# Patient Record
Sex: Male | Born: 1970
Health system: Southern US, Community
[De-identification: ages and names within clinical notes are randomized; demographics above are authoritative.]

## PROBLEM LIST (undated history)

## (undated) DIAGNOSIS — M109 Gout, unspecified: Secondary | ICD-10-CM

## (undated) DIAGNOSIS — I10 Essential (primary) hypertension: Secondary | ICD-10-CM

---
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
## 2012-10-16 ENCOUNTER — Encounter: Payer: Self-pay | Admitting: *Deleted

## 2012-10-16 ENCOUNTER — Emergency Department
Admission: EM | Admit: 2012-10-16 | Discharge: 2012-10-16 | Disposition: A | Discharge status: Home / Self Care | Payer: Self-pay | Source: Home / Self Care | Attending: Family Medicine | Admitting: Family Medicine

## 2012-10-16 DIAGNOSIS — M109 Gout, unspecified: Secondary | ICD-10-CM

## 2012-10-16 HISTORY — DX: Essential (primary) hypertension: I10

## 2012-10-16 MED ORDER — COLCHICINE 0.6 MG PO TABS: ORAL_TABLET | ORAL | Status: DC

## 2012-10-16 MED ORDER — PREDNISONE 20 MG PO TABS: 20.0000 mg | ORAL_TABLET | Freq: Two times a day (BID) | ORAL | Status: DC

## 2012-10-16 MED ORDER — HYDROCODONE-ACETAMINOPHEN 5-325 MG PO TABS: ORAL_TABLET | ORAL | Status: DC

## 2012-10-16 NOTE — ED Notes (Signed)
Patient reports gout flare up in left ankle x 8 days. He has a Hx of gout in same location. Swelling and pain is now extended from ankle to toes in left foot. Taken IBF without relief. Previous ankle injury and surgery with plates in left ankle.

## 2012-10-16 NOTE — ED Provider Notes (Signed)
History     CSN: [id number]  Arrival date & time 10/16/12  1012   First MD Initiated Contact with Patient 10/16/12 1040      Chief Complaint  Patient presents with  . Gout      HPI Comments: Patient complains of a recurrent flare-up of gout in his left ankle, foot, and great toe for about 8 days.  He states that he began having gout attacks about 3 years ago when he started taking blood pressure medicine (note lisinopril/HCTZ), and now has a flare-up about every two months.  This episode has lasted the longest of any previous, and has not responded to Ibuprofen.  He has difficulty walking.  Patient is a 42 y.o. male presenting with lower extremity pain. The history is provided by the patient and the spouse.  Foot Pain This is a recurrent problem. Episode onset: 8 days ago. The problem occurs constantly. The problem has been gradually worsening. Associated symptoms comments: none. The symptoms are aggravated by walking and standing. Nothing relieves the symptoms. Treatments tried: Ibuprofen. The treatment provided mild relief.    Past Medical History  Diagnosis Date  . Hypertension     Past Surgical History  Procedure Date  . Ankle fracture surgery     left    History reviewed. No pertinent family history.  History  Substance Use Topics  . Smoking status: Former Games developer  . Smokeless tobacco: Not on file  . Alcohol Use: No      Review of Systems  All other systems reviewed and are negative.    Allergies  Review of patient's allergies indicates no known allergies.  Home Medications   Current Outpatient Rx  Name  Route  Sig  Dispense  Refill  . LISINOPRIL 20 MG PO TABS   Oral   Take 20 mg by mouth daily.         Marland Kitchen METOPROLOL TARTRATE 50 MG PO TABS   Oral   Take 50 mg by mouth 2 (two) times daily.         . COLCHICINE 0.6 MG PO TABS      Take two tabs by mouth stat, then one tab one hour later   3 tablet   2   . HYDROCODONE-ACETAMINOPHEN 5-325 MG PO  TABS      Take one by mouth at bedtime as needed for pain   10 tablet   0   . PREDNISONE 20 MG PO TABS   Oral   Take 1 tablet (20 mg total) by mouth 2 (two) times daily.   10 tablet   0     BP 133/91  Pulse 75  Temp 99 F (37.2 C) (Oral)  Resp 14  Ht 6' (1.829 m)  Wt 274 lb (124.286 kg)  BMI 37.16 kg/m2  SpO2 96%  Physical Exam Nursing notes and Vital Signs reviewed. Appearance:  Patient appears stated age, and in no acute distress. Patient is obese (BMI 37.2) Eyes:  Pupils are equal, round, and reactive to light and accomodation.  Extraocular movement is intact.  Conjunctivae are not inflamed  Pharynx:  Normal Neck:  Supple.   No adenopathy Lungs:  Clear to auscultation.  Breath sounds are equal.  Heart:  Regular rate and rhythm without murmurs, rubs, or gallops.  Abdomen:  Nontender without masses or hepatosplenomegaly.  Bowel sounds are present.  No CVA or flank tenderness.  Extremities:  No edema.  No calf tenderness.  Left ankle/foot slightly swollen.  There is  faint erythema over medial dorsum of foot.  There is tenderness over the left first MTP joint, dorsal foot, and medial malleolus.  Left ankle has decreased range of motion. Skin:  No rash present.   ED Course  Procedures none      1. Acute gouty arthritis       MDM  Begin prednisone burst.  Lortab for bedtime pain Given Rx for Colcrys to take for next flare-up of gout.  Discussed diet modifications. Followup with Family Doctor for further management         Lattie Haw, MD 10/16/12 [phone]

## 2014-06-30 ENCOUNTER — Encounter: Payer: Self-pay | Admitting: Emergency Medicine

## 2014-06-30 ENCOUNTER — Emergency Department
Admission: EM | Admit: 2014-06-30 | Discharge: 2014-06-30 | Disposition: A | Discharge status: Home / Self Care | Payer: Self-pay | Source: Home / Self Care | Attending: Emergency Medicine | Admitting: Emergency Medicine

## 2014-06-30 DIAGNOSIS — B029 Zoster without complications: Secondary | ICD-10-CM

## 2014-06-30 MED ORDER — HYDROCODONE-ACETAMINOPHEN 5-325 MG PO TABS: 1.0000 | ORAL_TABLET | ORAL | Status: DC | PRN

## 2014-06-30 MED ORDER — PREDNISONE 20 MG PO TABS: 20.0000 mg | ORAL_TABLET | Freq: Two times a day (BID) | ORAL | Status: DC

## 2014-06-30 MED ORDER — VALACYCLOVIR HCL 1 G PO TABS: ORAL_TABLET | ORAL | Status: DC

## 2014-06-30 NOTE — ED Notes (Signed)
Pt has painful blistering rash on L side of neck and upper back.  Satrted 5 days ago pain is 4/10.  Unsure if he ever had chicken pox but suspected shingles.

## 2014-06-30 NOTE — Discharge Instructions (Signed)
Shingles Shingles is caused by the same virus that causes chickenpox. The first feelings may be pain or tingling. A rash will follow in a couple days. The rash may occur on any area of the body. Long-lasting pain is more likely in an elderly person. It can last months to years. There are medicines that can help prevent pain if you start taking them early. Prescriptions to fill now: Valtrex and Vicodin. If not improving in 2 days, fill prescription for prednisone  HOME CARE   Take cool baths or place cool cloths on the rash as told by your doctor.  Take medicine only as told by your doctor.  Rest as told by your doctor.  Keep your rash clean with mild soap and cool water or as told by your doctor.  Do not scratch your rash. You may use calamine lotion to relieve itchy skin as told by your doctor.  Keep your rash covered with a loose bandage (dressing).  Avoid touching:  Babies.  Pregnant women.  Children with inflamed skin (eczema).  People who have gotten organ transplants.  People with chronic illnesses, such as leukemia or AIDS.  Wear loose-fitting clothing.  If the rash is on the face, you may need to see a specialist. Keep all appointments. Shingles must be kept away from the eyes, if possible.  Keep all follow-up visits as told by your doctor. GET HELP RIGHT AWAY IF:   You have any pain on the face or eye.  You lose feeling on one side of your face.  You have ear pain or ringing in your ear.  You cannot taste as well.  Your medicines do not help the pain.  Your redness or puffiness (swelling) spreads.  You feel like you are getting worse.  You have a fever. MAKE SURE YOU:   Understand these instructions.  Will watch your condition.  Will get help right away if you are not doing well or get worse. Document Released: 03/08/2008 Document Revised: 02/04/2014 Document Reviewed: 03/08/2008

## 2014-07-01 NOTE — ED Provider Notes (Signed)
CSN: [id number]     Arrival date & time 06/30/14  1623 History   First MD Initiated Contact with Patient 06/30/14 1639     Chief Complaint  Patient presents with  . Rash   (Consider location/radiation/quality/duration/timing/severity/associated sxs/prior Treatment) HPI About 4 days ago, onset of vague deep sharp and prically nerve ending-type pain left lateral neck. 5/10 intensity . Then, acute shingles red blistering rash left side of neck times one day Past Medical History  Diagnosis Date  . Hypertension    Past Surgical History  Procedure Laterality Date  . Ankle fracture surgery      left   History reviewed. No pertinent family history. History  Substance Use Topics  . Smoking status: Former Research scientist (life sciences)  . Smokeless tobacco: Not on file  . Alcohol Use: No    Review of Systems  All other systems reviewed and are negative.   Allergies  Review of patient's allergies indicates no known allergies.  Home Medications   Prior to Admission medications   Medication Sig Start Date End Date Taking? Authorizing Provider  colchicine 0.6 MG tablet Take two tabs by mouth stat, then one tab one hour later 10/16/12   Kandra Nicolas, MD  HYDROcodone-acetaminophen (NORCO/VICODIN) 5-325 MG per tablet Take one by mouth at bedtime as needed for pain 10/16/12   Kandra Nicolas, MD  HYDROcodone-acetaminophen (NORCO/VICODIN) 5-325 MG per tablet Take 1-2 tablets by mouth every 4 (four) hours as needed for severe pain. Take with food. 06/30/14   Jacqulyn Cane, MD  lisinopril (PRINIVIL,ZESTRIL) 20 MG tablet Take 20 mg by mouth daily.    Historical Provider, MD  metoprolol (LOPRESSOR) 50 MG tablet Take 50 mg by mouth 2 (two) times daily.    Historical Provider, MD  predniSONE (DELTASONE) 20 MG tablet Take 1 tablet (20 mg total) by mouth 2 (two) times daily. 10/16/12   Kandra Nicolas, MD  predniSONE (DELTASONE) 20 MG tablet Take 1 tablet (20 mg total) by mouth 2 (two) times daily with a meal. 06/30/14    Jacqulyn Cane, MD  valACYclovir (VALTREX) 1000 MG tablet Take one by mouth 3 times a day for 7 days 06/30/14   Jacqulyn Cane, MD   BP 134/81  Pulse 86  Temp(Src) 99.7 F (37.6 C) (Oral)  Ht 6\' 1"  (1.854 m)  Wt 263 lb 8 oz (119.523 kg)  BMI 34.77 kg/m2  SpO2 98% Physical Exam  Nursing note and vitals reviewed. Constitutional: He is oriented to person, place, and time. He appears well-developed and well-nourished. No distress.  HENT:  Head: Normocephalic and atraumatic.  Eyes: Conjunctivae and EOM are normal. Pupils are equal, round, and reactive to light. No scleral icterus.  Neck: Normal range of motion.  Cardiovascular: Normal rate.   Pulmonary/Chest: Effort normal.  Abdominal: He exhibits no distension.  Musculoskeletal: Normal range of motion.  Neurological: He is alert and oriented to person, place, and time.  Skin: Skin is warm.  Psychiatric: He has a normal mood and affect.   Erythematous shingles rash left side of neck, does not cross midline. Eyes are not involved. ED Course  Procedures (including critical care time) Labs Review Labs Reviewed - No data to display  Imaging Review No results found.   MDM   1. Shingles    Treatment options discussed, as well as risks, benefits, alternatives. Patient voiced understanding and agreement with the following plans: Valtrex 1 g 3 times a day x7 days Vicodin if needed for severe pain Other nonpharmacologic measures  discussed. Discussed pros and cons of oral prednisone. He declined starting this now, but he is agreeable to take printed prescription for prednisone burst, to fill if symptoms not rapidly improving in the next 2-3 days. Follow-up with your primary care doctor in 5-7 days if not improving, or sooner if symptoms become worse. Precautions discussed. Red flags discussed. Questions invited and answered. Patient and wife voiced understanding and agreement.     Jacqulyn Cane, MD 07/01/14 [phone]

## 2014-11-01 ENCOUNTER — Emergency Department
Admission: EM | Admit: 2014-11-01 | Discharge: 2014-11-01 | Disposition: A | Discharge status: Home / Self Care | Payer: Self-pay | Source: Home / Self Care | Attending: Family Medicine | Admitting: Family Medicine

## 2014-11-01 ENCOUNTER — Emergency Department
Admission: EM | Admit: 2014-11-01 | Discharge: 2014-11-01 | Discharge status: Absent without leave | Payer: Self-pay | Source: Home / Self Care

## 2014-11-01 ENCOUNTER — Emergency Department (INDEPENDENT_AMBULATORY_CARE_PROVIDER_SITE_OTHER): Payer: Self-pay

## 2014-11-01 ENCOUNTER — Encounter: Payer: Self-pay | Admitting: *Deleted

## 2014-11-01 DIAGNOSIS — R05 Cough: Secondary | ICD-10-CM

## 2014-11-01 DIAGNOSIS — I10 Essential (primary) hypertension: Secondary | ICD-10-CM

## 2014-11-01 DIAGNOSIS — R053 Chronic cough: Secondary | ICD-10-CM

## 2014-11-01 MED ORDER — PREDNISONE 20 MG PO TABS: 20.0000 mg | ORAL_TABLET | Freq: Two times a day (BID) | ORAL | Status: DC

## 2014-11-01 MED ORDER — CLARITHROMYCIN 500 MG PO TABS: 500.0000 mg | ORAL_TABLET | Freq: Two times a day (BID) | ORAL | Status: DC

## 2014-11-01 MED ORDER — BENZONATATE 200 MG PO CAPS: 200.0000 mg | ORAL_CAPSULE | Freq: Every day | ORAL | Status: DC

## 2014-11-01 MED ORDER — VALSARTAN 40 MG PO TABS: ORAL_TABLET | ORAL | Status: DC

## 2014-11-01 NOTE — ED Provider Notes (Signed)
CSN: [id number]     Arrival date & time 11/01/14  1524 History   First MD Initiated Contact with Patient 11/01/14 1556     Chief Complaint  Patient presents with  . Cough      HPI Comments: About two months ago patient developed typical cold-like symptoms including mild sore throat, sinus congestion, headache, fatigue, and cough.  All symptoms cleared except for his cough which had become non-productive and worse at night.  He now has wheezing and shortness of breath with activity and often coughs until he gags.  He has had chills over the past week. He states that his last Tdap was in 1999.  He has been taking Lisinopril 20mg  daily since 2012.  The history is provided by the patient.    Past Medical History  Diagnosis Date  . Hypertension    Past Surgical History  Procedure Laterality Date  . Ankle fracture surgery      left   History reviewed. No pertinent family history. History  Substance Use Topics  . Smoking status: Former Smoker    Quit date: 11/02/1995  . Smokeless tobacco: Never Used  . Alcohol Use: No    Review of Systems No sore throat + cough No pleuritic pain + wheezing No nasal congestion ? post-nasal drainage No sinus pain/pressure No itchy/red eyes No earache No hemoptysis + SOB with activity No fever/chills No nausea No vomiting No abdominal pain No diarrhea No urinary symptoms No skin rash + fatigue No myalgias + headache Used OTC meds without relief  Allergies  Review of patient's allergies indicates no known allergies.  Home Medications   Prior to Admission medications   Medication Sig Start Date End Date Taking? Authorizing Provider  benzonatate (TESSALON) 200 MG capsule Take 1 capsule (200 mg total) by mouth at bedtime. Take as needed for cough 11/01/14   Kandra Nicolas, MD  clarithromycin (BIAXIN) 500 MG tablet Take 1 tablet (500 mg total) by mouth 2 (two) times daily. Take with food. 11/01/14   Kandra Nicolas, MD  metoprolol  (LOPRESSOR) 50 MG tablet Take 50 mg by mouth 2 (two) times daily.    Historical Provider, MD  predniSONE (DELTASONE) 20 MG tablet Take 1 tablet (20 mg total) by mouth 2 (two) times daily. Take with food. 11/01/14   Kandra Nicolas, MD  valsartan (DIOVAN) 40 MG tablet Take one tab by mouth each morning for blood pressure 11/01/14   Kandra Nicolas, MD   BP 134/80 mmHg  Pulse 63  Temp(Src) 98.7 F (37.1 C) (Oral)  Resp 14  Wt 269 lb (122.018 kg)  SpO2 97% Physical Exam Nursing notes and Vital Signs reviewed. Appearance:  Patient appears stated age, and in no acute distress.  Patient is obese Eyes:  Pupils are equal, round, and reactive to light and accomodation.  Extraocular movement is intact.  Conjunctivae are not inflamed  Ears:  Canals normal.  Tympanic membranes normal.  Nose:  Mildly congested turbinates.  No sinus tenderness.    Pharynx:  Normal Neck:  Supple.  No adenopathy Lungs:  Clear to auscultation.  Breath sounds are equal.  Heart:  Regular rate and rhythm without murmurs, rubs, or gallops.  Abdomen:  Nontender without masses or hepatosplenomegaly.  Bowel sounds are present.  No CVA or flank tenderness.  Extremities:  No edema.  No calf tenderness Skin:  No rash present.   ED Course  Procedures  none    Imaging Review Dg Chest 2 View  11/01/2014   CLINICAL DATA:  44 year old male with persistent nonproductive cough for the past 2 months.  EXAM: CHEST  2 VIEW  COMPARISON:  No priors.  FINDINGS: Lung volumes are low. Mild diffuse peribronchial cuffing. No consolidative airspace disease. No pleural effusions. No pneumothorax. No pulmonary nodule or mass noted. Pulmonary vasculature and the cardiomediastinal silhouette are within normal limits.  IMPRESSION: 1. Mild diffuse peribronchial cuffing which may suggest mild bronchitis.   Electronically Signed   By: Vinnie Langton M.D.   On: 11/01/2014 16:46     MDM   1. Chronic cough; suspect ACEI induced (?pertussis also)   2.  Essential hypertension    Take plain guaifenesin 1200mg  (Mucinex) twice daily, with plenty of water, for cough and congestion.  Treat bronchitis with Biaxin 500mg  BID to cover atypicals.  Begin prednisone burst. Discontinue lisinopril and begin Diovan (valsartan) 40mg , one each morning for blood pressure.  Continue metoprolol.  Measure blood pressure more frequently (different times of day) and record on calendar. Followup with PCP in 10 to 14 days.  Will probably need Diovan 80mg  daily.    Kandra Nicolas, MD 11/01/14 [phone]

## 2014-11-01 NOTE — Discharge Instructions (Signed)
Take plain guaifenesin 1200mg  (Mucinex) twice daily, with plenty of water, for cough and congestion.   Discontinue lisinopril and begin Diovan (valsartan) 40mg , one each morning for blood pressure.  Continue metoprolol.  Measure blood pressure more frequently (different times of day) and record on calendar.

## 2014-11-01 NOTE — ED Notes (Signed)
Barry Norton c/o dry cough, HA, tinnitus x 2 months. Denies fever.

## 2014-11-03 ENCOUNTER — Telehealth: Payer: Self-pay | Admitting: Emergency Medicine

## 2014-11-03 NOTE — ED Notes (Signed)
Inquired about patient's status; encourage them to call with questions/concerns.  

## 2015-03-07 ENCOUNTER — Emergency Department
Admission: EM | Admit: 2015-03-07 | Discharge: 2015-03-07 | Disposition: A | Discharge status: Home / Self Care | Payer: Self-pay | Source: Home / Self Care | Attending: Emergency Medicine | Admitting: Emergency Medicine

## 2015-03-07 ENCOUNTER — Encounter: Payer: Self-pay | Admitting: Emergency Medicine

## 2015-03-07 DIAGNOSIS — I10 Essential (primary) hypertension: Secondary | ICD-10-CM

## 2015-03-07 MED ORDER — METOPROLOL TARTRATE 50 MG PO TABS: 50.0000 mg | ORAL_TABLET | Freq: Two times a day (BID) | ORAL | Status: DC

## 2015-03-07 MED ORDER — LISINOPRIL 20 MG PO TABS: 20.0000 mg | ORAL_TABLET | Freq: Every day | ORAL | Status: DC

## 2015-03-07 NOTE — ED Provider Notes (Signed)
CSN: [id number]     Arrival date & time 03/07/15  1000 History   First MD Initiated Contact with Patient 03/07/15 1031     Chief Complaint  Patient presents with  . Medication Refill   (Consider location/radiation/quality/duration/timing/severity/associated sxs/prior Treatment) Patient is a 44 y.o. male presenting with hypertension. The history is provided by the patient. No language interpreter was used.  Hypertension This is a new problem. The problem occurs constantly. The problem has been gradually worsening. Associated symptoms include shortness of breath. Pertinent negatives include no chest pain, no abdominal pain and no headaches. Nothing aggravates the symptoms. Nothing relieves the symptoms. He has tried nothing for the symptoms.  Pt needs blood pressure medications refilled.  Pt reports he is scheduled to be seen 6/17 at Dr. Theron Arista office but he is running out of medications  Past Medical History  Diagnosis Date  . Hypertension    Past Surgical History  Procedure Laterality Date  . Ankle fracture surgery      left   History reviewed. No pertinent family history. History  Substance Use Topics  . Smoking status: Former Smoker    Quit date: 11/02/1995  . Smokeless tobacco: Never Used  . Alcohol Use: No    Review of Systems  Respiratory: Positive for shortness of breath.   Cardiovascular: Negative for chest pain.  Gastrointestinal: Negative for abdominal pain.  Neurological: Negative for headaches.  All other systems reviewed and are negative.   Allergies  Review of patient's allergies indicates no known allergies.  Home Medications   Prior to Admission medications   Medication Sig Start Date End Date Taking? Authorizing Provider  benzonatate (TESSALON) 200 MG capsule Take 1 capsule (200 mg total) by mouth at bedtime. Take as needed for cough 11/01/14   Kandra Nicolas, MD  clarithromycin (BIAXIN) 500 MG tablet Take 1 tablet (500 mg total) by mouth 2 (two) times  daily. Take with food. 11/01/14   Kandra Nicolas, MD  lisinopril (PRINIVIL,ZESTRIL) 20 MG tablet Take 1 tablet (20 mg total) by mouth daily. 03/07/15   Fransico Meadow, PA-C  metoprolol (LOPRESSOR) 50 MG tablet Take 1 tablet (50 mg total) by mouth 2 (two) times daily. 03/07/15   Fransico Meadow, PA-C  predniSONE (DELTASONE) 20 MG tablet Take 1 tablet (20 mg total) by mouth 2 (two) times daily. Take with food. 11/01/14   Kandra Nicolas, MD  valsartan (DIOVAN) 40 MG tablet Take one tab by mouth each morning for blood pressure 11/01/14   Kandra Nicolas, MD   BP 118/75 mmHg  Pulse 62  Temp(Src) 98.4 F (36.9 C) (Oral)  SpO2 97% Physical Exam  Constitutional: He is oriented to person, place, and time. He appears well-developed and well-nourished.  HENT:  Head: Normocephalic and atraumatic.  Eyes: EOM are normal. Pupils are equal, round, and reactive to light.  Neck: Normal range of motion.  Cardiovascular: Normal rate.   Pulmonary/Chest: Effort normal.  Abdominal: He exhibits no distension.  Musculoskeletal: Normal range of motion.  Neurological: He is alert and oriented to person, place, and time.  Skin: Skin is warm.  Psychiatric: He has a normal mood and affect.  Nursing note and vitals reviewed.   ED Course  Procedures (including critical care time) Labs Review Labs Reviewed - No data to display  Imaging Review No results found.   MDM   1. Essential hypertension    Rx for lisinopril rs for metoprolol AVS    Fransico Meadow, PA-C  03/07/15 1200 

## 2015-03-07 NOTE — Discharge Instructions (Signed)

## 2015-03-07 NOTE — ED Notes (Signed)
Pt here for refill on BP medications. Has appt at family practice on 6/17. States his BP has been well controlled.

## 2015-03-18 ENCOUNTER — Ambulatory Visit: Payer: Self-pay | Admitting: Family Medicine

## 2015-03-21 ENCOUNTER — Ambulatory Visit (INDEPENDENT_AMBULATORY_CARE_PROVIDER_SITE_OTHER): Payer: Self-pay | Admitting: Family Medicine

## 2015-03-21 ENCOUNTER — Encounter: Payer: Self-pay | Admitting: Family Medicine

## 2015-03-21 DIAGNOSIS — Z0289 Encounter for other administrative examinations: Secondary | ICD-10-CM

## 2015-03-21 NOTE — Progress Notes (Signed)
No show new patient 03/21/2015

## 2015-06-16 ENCOUNTER — Encounter: Payer: Self-pay | Admitting: *Deleted

## 2015-06-16 ENCOUNTER — Emergency Department
Admission: EM | Admit: 2015-06-16 | Discharge: 2015-06-16 | Disposition: A | Discharge status: Home / Self Care | Payer: Self-pay | Source: Home / Self Care | Attending: Family Medicine | Admitting: Family Medicine

## 2015-06-16 DIAGNOSIS — I10 Essential (primary) hypertension: Secondary | ICD-10-CM

## 2015-06-16 DIAGNOSIS — M10471 Other secondary gout, right ankle and foot: Secondary | ICD-10-CM

## 2015-06-16 HISTORY — DX: Gout, unspecified: M10.9

## 2015-06-16 MED ORDER — PREDNISONE 20 MG PO TABS: 20.0000 mg | ORAL_TABLET | Freq: Two times a day (BID) | ORAL | Status: DC

## 2015-06-16 MED ORDER — LISINOPRIL 20 MG PO TABS: 20.0000 mg | ORAL_TABLET | Freq: Every day | ORAL | Status: DC

## 2015-06-16 MED ORDER — METOPROLOL TARTRATE 50 MG PO TABS: 50.0000 mg | ORAL_TABLET | Freq: Two times a day (BID) | ORAL | Status: DC

## 2015-06-16 NOTE — ED Notes (Signed)
Pt reports gout flare to right ankle x 3 days. Pain and swelling present. He is establishing care with Dr. Ileene Rubens, apt in 2 weeks. He is currently in need of a Rx for his BP medications, Lisinopril 20mg  and Metoprolol 50mg , to last him until apt with his new PCP.

## 2015-06-16 NOTE — Discharge Instructions (Signed)
Continue increased fluid intake.    Gout Gout is an inflammatory arthritis caused by a buildup of uric acid crystals in the joints. Uric acid is a chemical that is normally present in the blood. When the level of uric acid in the blood is too high it can form crystals that deposit in your joints and tissues. This causes joint redness, soreness, and swelling (inflammation). Repeat attacks are common. Over time, uric acid crystals can form into masses (tophi) near a joint, destroying bone and causing disfigurement. Gout is treatable and often preventable. CAUSES  The disease begins with elevated levels of uric acid in the blood. Uric acid is produced by your body when it breaks down a naturally found substance called purines. Certain foods you eat, such as meats and fish, contain high amounts of purines. Causes of an elevated uric acid level include:  Being passed down from parent to child (heredity).  Diseases that cause increased uric acid production (such as obesity, psoriasis, and certain cancers).  Excessive alcohol use.  Diet, especially diets rich in meat and seafood.  Medicines, including certain cancer-fighting medicines (chemotherapy), water pills (diuretics), and aspirin.  Chronic kidney disease. The kidneys are no longer able to remove uric acid well.  Problems with metabolism. Conditions strongly associated with gout include:  Obesity.  High blood pressure.  High cholesterol.  Diabetes. Not everyone with elevated uric acid levels gets gout. It is not understood why some people get gout and others do not. Surgery, joint injury, and eating too much of certain foods are some of the factors that can lead to gout attacks. SYMPTOMS   An attack of gout comes on quickly. It causes intense pain with redness, swelling, and warmth in a joint.  Fever can occur.  Often, only one joint is involved. Certain joints are more commonly involved:  Base of the big  toe.  Knee.  Ankle.  Wrist.  Finger. Without treatment, an attack usually goes away in a few days to weeks. Between attacks, you usually will not have symptoms, which is different from many other forms of arthritis. DIAGNOSIS  Your caregiver will suspect gout based on your symptoms and exam. In some cases, tests may be recommended. The tests may include:  Blood tests.  Urine tests.  X-rays.  Joint fluid exam. This exam requires a needle to remove fluid from the joint (arthrocentesis). Using a microscope, gout is confirmed when uric acid crystals are seen in the joint fluid. TREATMENT  There are two phases to gout treatment: treating the sudden onset (acute) attack and preventing attacks (prophylaxis).  Treatment of an Acute Attack.  Medicines are used. These include anti-inflammatory medicines or steroid medicines.  An injection of steroid medicine into the affected joint is sometimes necessary.  The painful joint is rested. Movement can worsen the arthritis.  You may use warm or cold treatments on painful joints, depending which works best for you.  Treatment to Prevent Attacks.  If you suffer from frequent gout attacks, your caregiver may advise preventive medicine. These medicines are started after the acute attack subsides. These medicines either help your kidneys eliminate uric acid from your body or decrease your uric acid production. You may need to stay on these medicines for a very long time.  The early phase of treatment with preventive medicine can be associated with an increase in acute gout attacks. For this reason, during the first few months of treatment, your caregiver may also advise you to take medicines usually used  for acute gout treatment. Be sure you understand your caregiver's directions. Your caregiver may make several adjustments to your medicine dose before these medicines are effective.  Discuss dietary treatment with your caregiver or dietitian.  Alcohol and drinks high in sugar and fructose and foods such as meat, poultry, and seafood can increase uric acid levels. Your caregiver or dietitian can advise you on drinks and foods that should be limited. HOME CARE INSTRUCTIONS   Do not take aspirin to relieve pain. This raises uric acid levels.  Only take over-the-counter or prescription medicines for pain, discomfort, or fever as directed by your caregiver.  Rest the joint as much as possible. When in bed, keep sheets and blankets off painful areas.  Keep the affected joint raised (elevated).  Apply warm or cold treatments to painful joints. Use of warm or cold treatments depends on which works best for you.  Use crutches if the painful joint is in your leg.  Drink enough fluids to keep your urine clear or pale yellow. This helps your body get rid of uric acid. Limit alcohol, sugary drinks, and fructose drinks.  Follow your dietary instructions. Pay careful attention to the amount of protein you eat. Your daily diet should emphasize fruits, vegetables, whole grains, and fat-free or low-fat milk products. Discuss the use of coffee, vitamin C, and cherries with your caregiver or dietitian. These may be helpful in lowering uric acid levels.  Maintain a healthy body weight. SEEK MEDICAL CARE IF:   You develop diarrhea, vomiting, or any side effects from medicines.  You do not feel better in 24 hours, or you are getting worse. SEEK IMMEDIATE MEDICAL CARE IF:   Your joint becomes suddenly more tender, and you have chills or a fever. MAKE SURE YOU:   Understand these instructions.  Will watch your condition.  Will get help right away if you are not doing well or get worse. Document Released: 09/17/2000 Document Revised: 02/04/2014 Document Reviewed: 05/03/2012 [facility] Patient Information 2015 [place], [place]. This information is not intended to replace advice given to you by your health care provider. Make sure you discuss any  questions you have with your health care provider.

## 2015-06-16 NOTE — ED Provider Notes (Signed)
CSN: [id number]     Arrival date & time 06/16/15  1408 History   First MD Initiated Contact with Patient 06/16/15 1432     Chief Complaint  Patient presents with  . Gout  . Medication Refill      HPI Comments: Patient presents with two problems: 1)  He is in the process of transferring his medical care (will be evaluated by Dr. Ileene Rubens in two weeks).  He has hypertension and has run out of his medication.  His blood pressure is normally controlled by lisinopril 20mg  daily, and metoprolol 50mg  daily.  He has had no adverse reactions from these medications. 2)  He has a history of gout.  He states that several days ago he ate a significant amount of pork and crab meat, and subsequently had a typical flare-up of gout in his right ankle.  No fevers, chills, and sweats.  Patient is a 44 y.o. male presenting with ankle pain. The history is provided by the patient.  Ankle Pain Associated symptoms: no fatigue and no fever     Past Medical History  Diagnosis Date  . Hypertension   . Gout    Past Surgical History  Procedure Laterality Date  . Ankle fracture surgery      left   History reviewed. No pertinent family history. Social History  Substance Use Topics  . Smoking status: Former Smoker    Quit date: 11/02/1995  . Smokeless tobacco: Never Used  . Alcohol Use: No    Review of Systems  Constitutional: Negative for fever, chills, diaphoresis and fatigue.  Eyes: Negative.   Respiratory: Negative.   Cardiovascular: Negative for chest pain.  Gastrointestinal: Negative.   Endocrine: Negative.   Genitourinary: Negative.   Musculoskeletal: Positive for joint swelling.  Skin: Negative.   Neurological: Positive for headaches.    Allergies  Review of patient's allergies indicates no known allergies.  Home Medications   Prior to Admission medications   Medication Sig Start Date End Date Taking? Authorizing Provider  lisinopril (PRINIVIL,ZESTRIL) 20 MG tablet Take 1 tablet (20 mg  total) by mouth daily. 06/16/15   Kandra Nicolas, MD  metoprolol (LOPRESSOR) 50 MG tablet Take 1 tablet (50 mg total) by mouth 2 (two) times daily. 06/16/15   Kandra Nicolas, MD  predniSONE (DELTASONE) 20 MG tablet Take 1 tablet (20 mg total) by mouth 2 (two) times daily. Take with food. 06/16/15   Kandra Nicolas, MD   Meds Ordered and Administered this Visit  Medications - No data to display  BP 142/85 mmHg  Pulse 83  Temp(Src) 98.4 F (36.9 C) (Oral)  Resp 16  Wt 270 lb (122.471 kg)  SpO2 96% No data found.   Physical Exam Nursing notes and Vital Signs reviewed. Appearance:  Patient appears stated age, and in no acute distress.  Patient is obese. Eyes:  Pupils are equal, round, and reactive to light and accomodation.  Extraocular movement is intact.  Conjunctivae are not inflamed  Pharynx:  Normal Neck:  Supple.  No adenopathy Lungs:  Clear to auscultation.  Breath sounds are equal.  Moving air well. Heart:  Regular rate and rhythm without murmurs, rubs, or gallops.  Abdomen:  Nontender without masses or hepatosplenomegaly.  Bowel sounds are present.  No CVA or flank tenderness.  Extremities:  No edema.  No calf tenderness.  Right ankle:  Mild swelling with decreased range of motion and tenderness to palpation.  Distal neurovascular function is intact.  Skin:  No  rash present.   ED Course  Procedures  None    MDM   1. Other secondary acute gout of right ankle   2. Essential hypertension, benign    Begin prednisone burst. Continue increased fluid intake. Refill lisinopril 20mg  and metoprolol 50mg  (two week supply) Followup with Family Doctor as scheduled.    Kandra Nicolas, MD 06/23/15 [phone]

## 2015-06-27 ENCOUNTER — Encounter: Payer: Self-pay | Admitting: Family Medicine

## 2015-06-27 ENCOUNTER — Ambulatory Visit (INDEPENDENT_AMBULATORY_CARE_PROVIDER_SITE_OTHER): Payer: Self-pay | Admitting: Family Medicine

## 2015-06-27 VITALS — BP 122/80 | HR 83 | Ht 72.0 in | Wt 276.0 lb

## 2015-06-27 DIAGNOSIS — M109 Gout, unspecified: Secondary | ICD-10-CM

## 2015-06-27 DIAGNOSIS — M10079 Idiopathic gout, unspecified ankle and foot: Secondary | ICD-10-CM

## 2015-06-27 DIAGNOSIS — I10 Essential (primary) hypertension: Secondary | ICD-10-CM

## 2015-06-27 MED ORDER — METOPROLOL TARTRATE 50 MG PO TABS: 50.0000 mg | ORAL_TABLET | Freq: Two times a day (BID) | ORAL | Status: DC

## 2015-06-27 MED ORDER — LISINOPRIL 20 MG PO TABS: 20.0000 mg | ORAL_TABLET | Freq: Every day | ORAL | Status: DC

## 2015-06-27 NOTE — Progress Notes (Signed)
CC: Barry Norton is a 44 y.o. male is here for Establish Care and Hypertension   Subjective: HPI:  Very pleasant 44 year old here to establish care  Reports a history of essential hypertension that spans back years. Is currently taking metoprolol and lisinopril. He tells me as long as he takes this on a daily basis is preserved normal. He denies any chest pain shortness of breath orthopnea nor peripheral edema. Denies any personal cardiovascular disease.  History of gout that's usually in either the right or left ankle. Most recent episode occurred this summer after eating a large meal of crabs. Provided he avoids shellfish is usually asymptomatic. Symptoms will respond to prednisone. Currently he denies any joint pain, joint swelling redness or warmth. Denies any chronic joint pain. He gets about 1-2 flares a year  Review of Systems - General ROS: negative for - chills, fever, night sweats, weight gain or weight loss Ophthalmic ROS: negative for - decreased vision Psychological ROS: negative for - anxiety or depression ENT ROS: negative for - hearing change, nasal congestion, tinnitus or allergies Hematological and Lymphatic ROS: negative for - bleeding problems, bruising or swollen lymph nodes Breast ROS: negative Respiratory ROS: no cough, shortness of breath, or wheezing Cardiovascular ROS: no chest pain or dyspnea on exertion Gastrointestinal ROS: no abdominal pain, change in bowel habits, or black or bloody stools Genito-Urinary ROS: negative for - genital discharge, genital ulcers, incontinence or abnormal bleeding from genitals Musculoskeletal ROS: negative for - joint pain or muscle pain Neurological ROS: negative for - headaches or memory loss Dermatological ROS: negative for lumps, mole changes, rash and skin lesion changes  Past Medical History  Diagnosis Date  . Hypertension   . Gout     Past Surgical History  Procedure Laterality Date  . Ankle fracture surgery  1988   left   Family History  Problem Relation Age of Onset  . Cervical cancer Mother     Social History   Social History  . Marital Status: Married    Spouse Name: N/A  . Number of Children: N/A  . Years of Education: N/A   Occupational History  . Not on file.   Social History Main Topics  . Smoking status: Former Smoker    Quit date: 11/02/1995  . Smokeless tobacco: Never Used  . Alcohol Use: 0.0 oz/week    0 Standard drinks or equivalent per week  . Drug Use: No  . Sexual Activity:    Partners: Female   Other Topics Concern  . Not on file   Social History Narrative     Objective: BP 122/80 mmHg  Pulse 83  Ht 6' (1.829 m)  Wt 276 lb (125.193 kg)  BMI 37.42 kg/m2  General: Alert and Oriented, No Acute Distress HEENT: Pupils equal, round, reactive to light. Conjunctivae clear.  Moist mucous membranes Lungs: Clear to auscultation bilaterally, no wheezing/ronchi/rales.  Comfortable work of breathing. Good air movement. Cardiac: Regular rate and rhythm. Normal S1/S2.  No murmurs, rubs, nor gallops.   Abdomen: Mild obesity Extremities: No peripheral edema.  Strong peripheral pulses.  Mental Status: No depression, anxiety, nor agitation. Skin: Warm and dry.  Assessment & Plan: Harrie was seen today for establish care and hypertension.  Diagnoses and all orders for this visit:  Gout of ankle, unspecified cause, unspecified chronicity, unspecified laterality  Essential hypertension -     lisinopril (PRINIVIL,ZESTRIL) 20 MG tablet; Take 1 tablet (20 mg total) by mouth daily. -     metoprolol (LOPRESSOR)  50 MG tablet; Take 1 tablet (50 mg total) by mouth 2 (two) times daily.   Gout: Currently asymptomatic, controlled, no indication for preventative therapy at this time. Agree with his decision to avoid shellfish Essential hypertension: Controlled continue lisinopril and metoprolol. Encouraged to follow up in the near future once he gets insurance so that we can do some  more extensive blood work. He tells me that his renal function was checked one year after he started on lisinopril and was normal.  Declines flu shot  Return if symptoms worsen or fail to improve.

## 2015-09-05 IMAGING — DX DG CHEST 2V
2 series · 2 of 2 positions shown · non-contrast
Comparison: No priors.

CLINICAL DATA: 43-year-old male with persistent nonproductive cough
for the past 2 months.

EXAM:
CHEST  2 VIEW

[chest pa]
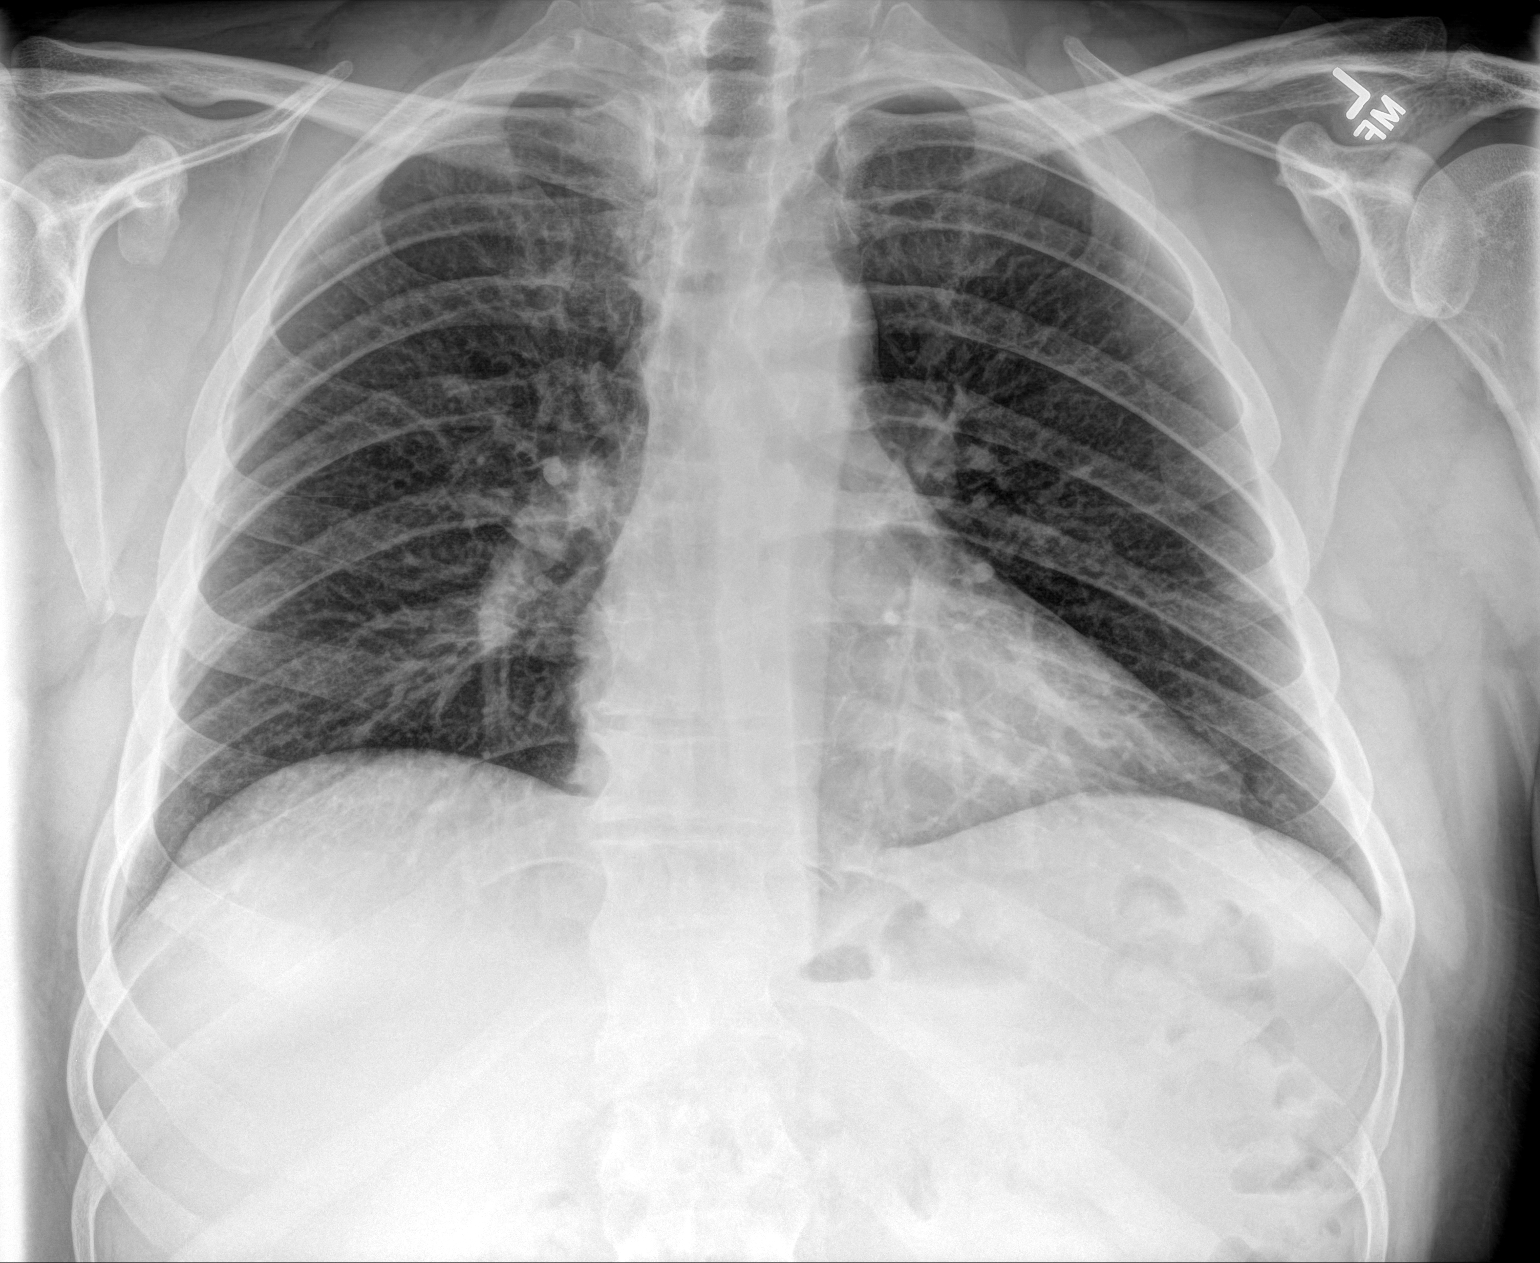

[chest lat]
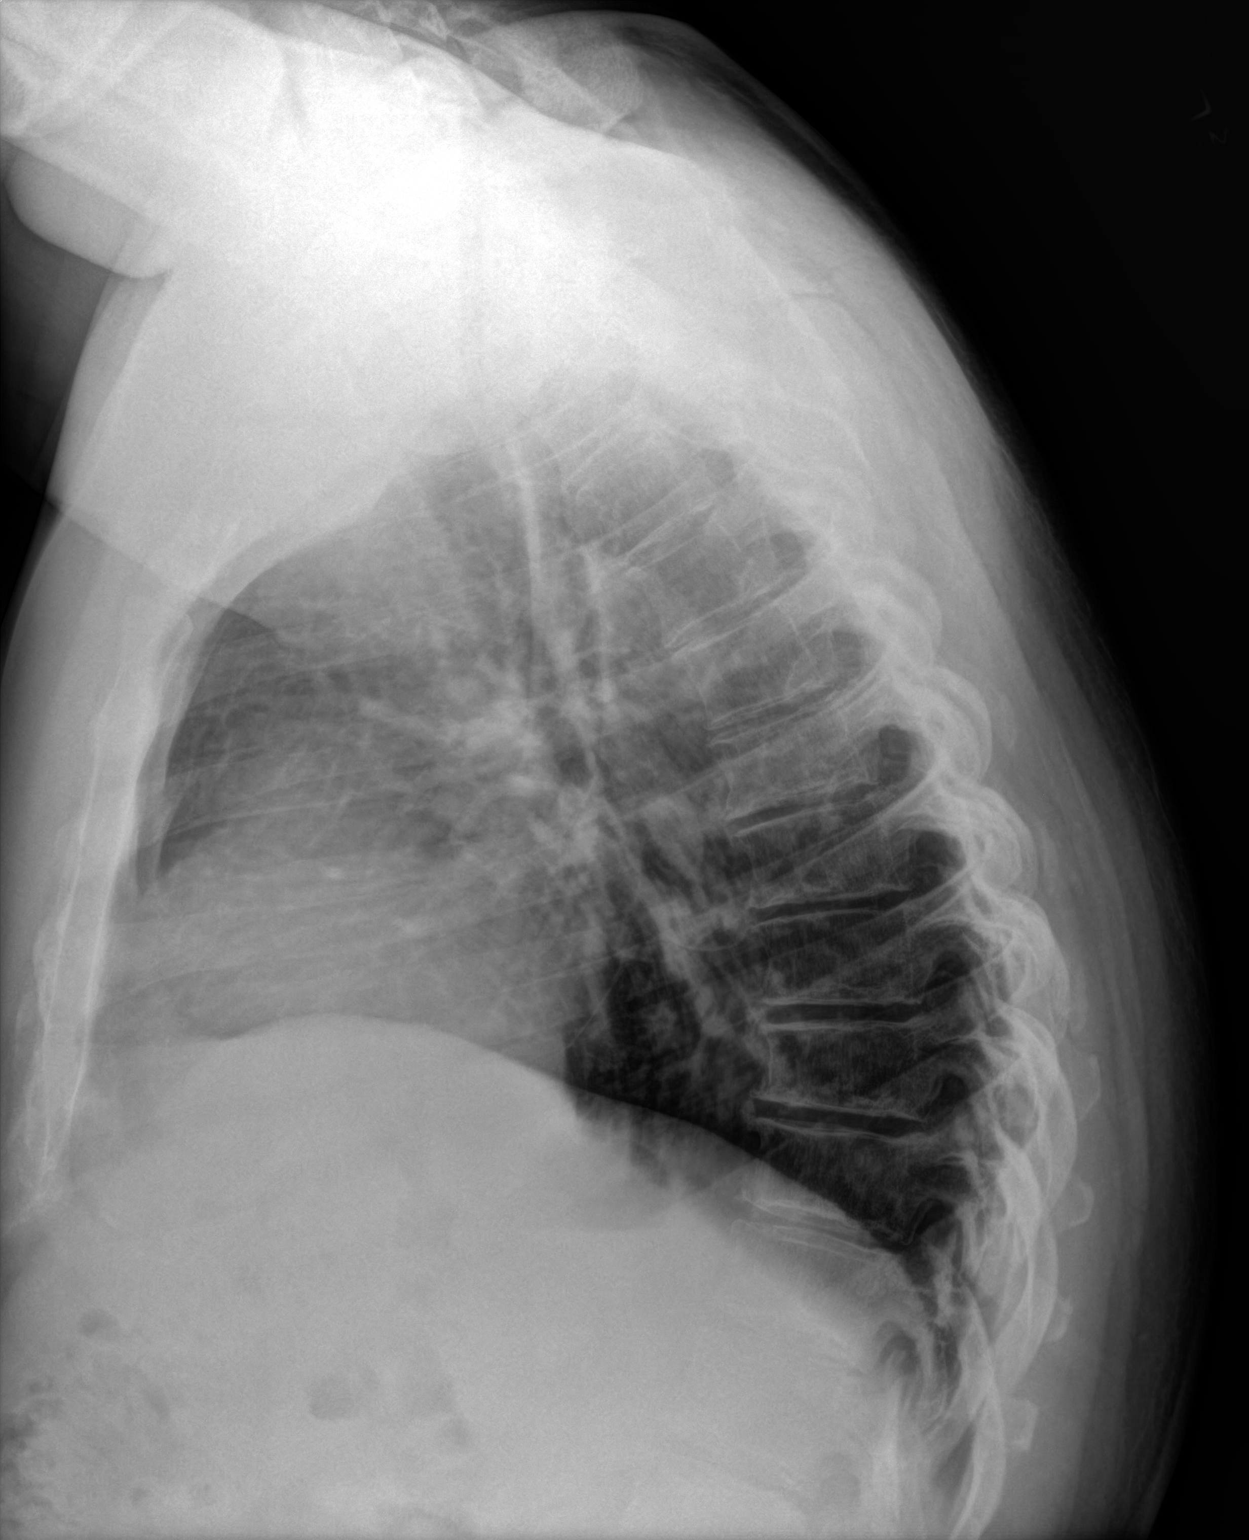

[2 of 2 positions shown; findings below may reference images not displayed]

FINDINGS: Lung volumes are low. Mild diffuse peribronchial cuffing. No
consolidative airspace disease. No pleural effusions. No
pneumothorax. No pulmonary nodule or mass noted. Pulmonary
vasculature and the cardiomediastinal silhouette are within normal
limits.
IMPRESSION: 1. Mild diffuse peribronchial cuffing which may suggest mild
bronchitis.

## 2015-09-23 ENCOUNTER — Ambulatory Visit: Payer: Self-pay | Admitting: Family Medicine

## 2015-10-10 ENCOUNTER — Ambulatory Visit (INDEPENDENT_AMBULATORY_CARE_PROVIDER_SITE_OTHER): Payer: Self-pay | Admitting: Family Medicine

## 2015-10-10 ENCOUNTER — Encounter: Payer: Self-pay | Admitting: Family Medicine

## 2015-10-10 VITALS — BP 125/78 | HR 55 | Wt 272.0 lb

## 2015-10-10 DIAGNOSIS — A499 Bacterial infection, unspecified: Secondary | ICD-10-CM

## 2015-10-10 DIAGNOSIS — E119 Type 2 diabetes mellitus without complications: Secondary | ICD-10-CM

## 2015-10-10 DIAGNOSIS — R7309 Other abnormal glucose: Secondary | ICD-10-CM

## 2015-10-10 DIAGNOSIS — R739 Hyperglycemia, unspecified: Secondary | ICD-10-CM

## 2015-10-10 DIAGNOSIS — I10 Essential (primary) hypertension: Secondary | ICD-10-CM

## 2015-10-10 DIAGNOSIS — B9689 Other specified bacterial agents as the cause of diseases classified elsewhere: Secondary | ICD-10-CM

## 2015-10-10 DIAGNOSIS — J329 Chronic sinusitis, unspecified: Secondary | ICD-10-CM

## 2015-10-10 LAB — POCT GLYCOSYLATED HEMOGLOBIN (HGB A1C): HEMOGLOBIN A1C: 8.6

## 2015-10-10 MED ORDER — AZITHROMYCIN 250 MG PO TABS: ORAL_TABLET | ORAL | Status: AC

## 2015-10-10 MED ORDER — METFORMIN HCL 1000 MG PO TABS: ORAL_TABLET | ORAL | Status: DC

## 2015-10-10 MED ORDER — METOPROLOL TARTRATE 50 MG PO TABS: 50.0000 mg | ORAL_TABLET | Freq: Two times a day (BID) | ORAL | Status: DC

## 2015-10-10 MED ORDER — LISINOPRIL 20 MG PO TABS: 20.0000 mg | ORAL_TABLET | Freq: Every day | ORAL | Status: DC

## 2015-10-10 NOTE — Progress Notes (Signed)
CC: Barry Norton is a 46 y.o. male is here for Hyperglycemia   Subjective: HPI:  2 weeks ago he started to notice that he was feeling sugar highs and then "crashing" one hour after eating a meal. He used a friend's glucometer and saw a blood sugar reading of 160 which has been the highest and lowest over the past 2 weeks  Regardless of when he ate is 120. He's never had this before. He is brother has type 2 diabetes but patient denies any known personal history with abnormal blood sugars. He denies any change in his diet or his physical activity routine. He denies polyuria or polyphagia polydipsia but things seems he might have noticed some mild blurred vision in hindsight now. No interventions as of yet  Follow up essential hypertension: He is taking lisinopril and metoprolol as prescribed without any known side effects. He denies any chest pain shortness of breath orthopnea or peripheral edema. No outside blood pressures to report  He also complains of nasal congestion and facial pressure and forehead that has been present for over 2 weeks now. No benefit from over-the-counter cough and cold medications. No fevers, chills or cough.  Review Of Systems Outlined In HPI  Past Medical History  Diagnosis Date  . Hypertension   . Gout     Past Surgical History  Procedure Laterality Date  . Ankle fracture surgery  1988    left   Family History  Problem Relation Age of Onset  . Cervical cancer Mother     Social History   Social History  . Marital Status: Married    Spouse Name: N/A  . Number of Children: N/A  . Years of Education: N/A   Occupational History  . Not on file.   Social History Main Topics  . Smoking status: Former Smoker    Quit date: 11/02/1995  . Smokeless tobacco: Never Used  . Alcohol Use: 0.0 oz/week    0 Standard drinks or equivalent per week  . Drug Use: No  . Sexual Activity:    Partners: Female   Other Topics Concern  . Not on file   Social History  Narrative     Objective: BP 125/78 mmHg  Pulse 55  Wt 272 lb (123.378 kg)  General: Alert and Oriented, No Acute Distress HEENT: Pupils equal, round, reactive to light. Conjunctivae clear.  Moist mucous membranes Lungs: Clear to auscultation bilaterally, no wheezing/ronchi/rales.  Comfortable work of breathing. Good air movement. Cardiac: Regular rate and rhythm. Normal S1/S2.  No murmurs, rubs, nor gallops.   Abdomen: mildly overweight Extremities: No peripheral edema.  Strong peripheral pulses.  Mental Status: No depression, anxiety, nor agitation. Skin: Warm and dry.  Assessment & Plan: Barry Norton was seen today for hyperglycemia.  Diagnoses and all orders for this visit:  Essential hypertension -     lisinopril (PRINIVIL,ZESTRIL) 20 MG tablet; Take 1 tablet (20 mg total) by mouth daily. -     metoprolol (LOPRESSOR) 50 MG tablet; Take 1 tablet (50 mg total) by mouth 2 (two) times daily.  Hyperglycemia  Abnormal blood sugar -     POCT HgB A1C  Type 2 diabetes mellitus without complication, without long-term current use of insulin (HCC) -     metFORMIN (GLUCOPHAGE) 1000 MG tablet; One tablet by mouth every evening for blood sugar control.  Bacterial sinusitis  Other orders -     azithromycin (ZITHROMAX) 250 MG tablet; Take two tabs at once on day 1, then one tab  daily on days 2-5.   New diagnosis of type 2 diabetes: Start metformin and also counseled on ways to reduce carbohydrates in the diet and increase physical activity to help lower blood sugar. Essential hypertension: Controlled with lisinopril and metoprolol, continue current dose. Spectral sinusitis: Start azithromycin and consider nasal saline washes. Avoid decongestants  Return in about 3 months (around 01/08/2016) for sugar.

## 2015-10-12 ENCOUNTER — Encounter: Payer: Self-pay | Admitting: Family Medicine

## 2015-10-12 DIAGNOSIS — E119 Type 2 diabetes mellitus without complications: Secondary | ICD-10-CM | POA: Insufficient documentation

## 2016-01-07 ENCOUNTER — Ambulatory Visit: Payer: Self-pay | Admitting: Family Medicine

## 2016-02-24 ENCOUNTER — Other Ambulatory Visit: Payer: Self-pay | Admitting: Family Medicine

## 2016-03-18 ENCOUNTER — Ambulatory Visit (INDEPENDENT_AMBULATORY_CARE_PROVIDER_SITE_OTHER): Payer: 59 | Admitting: Family Medicine

## 2016-03-18 ENCOUNTER — Encounter: Payer: Self-pay | Admitting: Family Medicine

## 2016-03-18 VITALS — BP 129/82 | HR 55 | Wt 267.0 lb

## 2016-03-18 DIAGNOSIS — E119 Type 2 diabetes mellitus without complications: Secondary | ICD-10-CM

## 2016-03-18 DIAGNOSIS — D234 Other benign neoplasm of skin of scalp and neck: Secondary | ICD-10-CM

## 2016-03-18 DIAGNOSIS — I10 Essential (primary) hypertension: Secondary | ICD-10-CM

## 2016-03-18 LAB — POCT GLYCOSYLATED HEMOGLOBIN (HGB A1C): HEMOGLOBIN A1C: 7.1

## 2016-03-18 MED ORDER — METOPROLOL TARTRATE 50 MG PO TABS: 50.0000 mg | ORAL_TABLET | Freq: Two times a day (BID) | ORAL | Status: DC

## 2016-03-18 NOTE — Progress Notes (Signed)
CC: Barry Norton is a 45 y.o. male is here for Medication Refill   Subjective: HPI:  Follow essential hypertension: Currently only taking metoprolol. Lisinopril caused an intolerable cough and lightheadedness. These 2 side effects disappeared a day or 2 after he stopped taking lisinopril. No outside blood pressures to report. He denies chest pain shortness of breath orthopnea nor peripheral edema.  Follow-up type 2 diabetes: Metformin caused intolerable diarrhea so he stopped taking this medication. He's been off of this for well over 3 months now. He is exercising more often, reduced carbohydrates in his diet and tells me he feels great lately. Denies polyuria pipe fascia polydipsia or vision disturbance.  He has a mass on his scalp has been enlarging over the past year on a monthly basis. It's painful to touch. He's had multiple cysts removed from elsewhere on the body. He reports no family can help arrange this to be removed.    Review Of Systems Outlined In HPI  Past Medical History  Diagnosis Date  . Hypertension   . Gout     Past Surgical History  Procedure Laterality Date  . Ankle fracture surgery  1988    left   Family History  Problem Relation Age of Onset  . Cervical cancer Mother     Social History   Social History  . Marital Status: Married    Spouse Name: N/A  . Number of Children: N/A  . Years of Education: N/A   Occupational History  . Not on file.   Social History Main Topics  . Smoking status: Former Smoker    Quit date: 11/02/1995  . Smokeless tobacco: Never Used  . Alcohol Use: 0.0 oz/week    0 Standard drinks or equivalent per week  . Drug Use: No  . Sexual Activity:    Partners: Female   Other Topics Concern  . Not on file   Social History Narrative     Objective: BP 129/82 mmHg  Pulse 55  Wt 267 lb (121.11 kg)  General: Alert and Oriented, No Acute Distress HEENT: Pupils equal, round, reactive to light. Conjunctivae clear. Moist  mucous membranes  Lungs: Clear to auscultation bilaterally, no wheezing/ronchi/rales.  Comfortable work of breathing. Good air movement. Cardiac: Regular rate and rhythm. Normal S1/S2.  No murmurs, rubs, nor gallops.   Extremities: No peripheral edema.  Strong peripheral pulses.  Mental Status: No depression, anxiety, nor agitation. Skin: Warm and dry.Firm tender mass on the back of the crown of his scalp with no overlying skin changes, does not appear to be adhered to the skull  Assessment & Plan: Barry Norton was seen today for medication refill.  Diagnoses and all orders for this visit:  Essential hypertension -     metoprolol (LOPRESSOR) 50 MG tablet; Take 1 tablet (50 mg total) by mouth 2 (two) times daily.  Type 2 diabetes mellitus without complication, without long-term current use of insulin (HCC) -     POCT HgB A1C  Dermoid cyst of scalp -     Ambulatory referral to Dermatology   Essential hypertension: Controlled with metoprolol alone Type 2 diabetes: A1c of 7.1, uncontrolled chronic condition, applauded his success with diet and exercise interventions, I let him know that if he works out just a little bit more every week he can probably get this under control and below 7.0. Dermoid cyst of the scalp: Referral to dermatology for removal.  Return in about 3 months (around 06/18/2016) for Repeat A1c.

## 2016-09-24 ENCOUNTER — Other Ambulatory Visit: Payer: Self-pay | Admitting: Osteopathic Medicine

## 2016-09-24 DIAGNOSIS — I10 Essential (primary) hypertension: Secondary | ICD-10-CM

## 2016-09-24 MED ORDER — METOPROLOL TARTRATE 50 MG PO TABS: 50.0000 mg | ORAL_TABLET | Freq: Two times a day (BID) | ORAL | 0 refills | Status: DC

## 2016-10-13 ENCOUNTER — Other Ambulatory Visit: Payer: Self-pay | Admitting: *Deleted

## 2016-10-13 DIAGNOSIS — I10 Essential (primary) hypertension: Secondary | ICD-10-CM

## 2016-10-13 MED ORDER — METOPROLOL TARTRATE 50 MG PO TABS: 50.0000 mg | ORAL_TABLET | Freq: Two times a day (BID) | ORAL | 0 refills | Status: DC

## 2016-10-26 ENCOUNTER — Ambulatory Visit: Payer: 59 | Admitting: Osteopathic Medicine

## 2016-12-06 ENCOUNTER — Other Ambulatory Visit: Payer: Self-pay | Admitting: Emergency Medicine

## 2016-12-06 ENCOUNTER — Telehealth: Payer: Self-pay | Admitting: Emergency Medicine

## 2016-12-06 DIAGNOSIS — I10 Essential (primary) hypertension: Secondary | ICD-10-CM

## 2016-12-06 MED ORDER — METOPROLOL TARTRATE 50 MG PO TABS: 50.0000 mg | ORAL_TABLET | Freq: Two times a day (BID) | ORAL | 0 refills | Status: DC

## 2016-12-06 NOTE — Telephone Encounter (Signed)
OK to refill x30 days

## 2016-12-06 NOTE — Telephone Encounter (Signed)
Patient stopped by to make appt with N.Alexander for 12/21/16; he wants to continue care with her (previous patient of Dr.Hommel); he needs refill on lopressor rx to last until then.

## 2016-12-21 ENCOUNTER — Ambulatory Visit (INDEPENDENT_AMBULATORY_CARE_PROVIDER_SITE_OTHER): Payer: 59 | Admitting: Osteopathic Medicine

## 2016-12-21 ENCOUNTER — Encounter: Payer: Self-pay | Admitting: Osteopathic Medicine

## 2016-12-21 VITALS — BP 131/69 | HR 62 | Ht 72.0 in | Wt 248.0 lb

## 2016-12-21 DIAGNOSIS — E119 Type 2 diabetes mellitus without complications: Secondary | ICD-10-CM

## 2016-12-21 DIAGNOSIS — Z7189 Other specified counseling: Secondary | ICD-10-CM

## 2016-12-21 DIAGNOSIS — Z8739 Personal history of other diseases of the musculoskeletal system and connective tissue: Secondary | ICD-10-CM

## 2016-12-21 DIAGNOSIS — I10 Essential (primary) hypertension: Secondary | ICD-10-CM

## 2016-12-21 DIAGNOSIS — E785 Hyperlipidemia, unspecified: Secondary | ICD-10-CM

## 2016-12-21 DIAGNOSIS — E1169 Type 2 diabetes mellitus with other specified complication: Secondary | ICD-10-CM

## 2016-12-21 LAB — CBC WITH DIFFERENTIAL/PLATELET
BASOS ABS: 0 {cells}/uL (ref 0–200)
Basophils Relative: 0 %
Eosinophils Absolute: 183 cells/uL (ref 15–500)
Eosinophils Relative: 3 %
HEMATOCRIT: 41.5 % (ref 38.5–50.0)
HEMOGLOBIN: 14.1 g/dL (ref 13.2–17.1)
LYMPHS ABS: 1830 {cells}/uL (ref 850–3900)
Lymphocytes Relative: 30 %
MCH: 30.5 pg (ref 27.0–33.0)
MCHC: 34 g/dL (ref 32.0–36.0)
MCV: 89.8 fL (ref 80.0–100.0)
MONO ABS: 366 {cells}/uL (ref 200–950)
MPV: 9.8 fL (ref 7.5–12.5)
Monocytes Relative: 6 %
NEUTROS ABS: 3721 {cells}/uL (ref 1500–7800)
NEUTROS PCT: 61 %
Platelets: 180 10*3/uL (ref 140–400)
RBC: 4.62 MIL/uL (ref 4.20–5.80)
RDW: 13.2 % (ref 11.0–15.0)
WBC: 6.1 10*3/uL (ref 3.8–10.8)

## 2016-12-21 LAB — COMPLETE METABOLIC PANEL WITH GFR
ALBUMIN: 4.2 g/dL (ref 3.6–5.1)
ALT: 25 U/L (ref 9–46)
AST: 17 U/L (ref 10–40)
Alkaline Phosphatase: 55 U/L (ref 40–115)
BILIRUBIN TOTAL: 0.9 mg/dL (ref 0.2–1.2)
BUN: 22 mg/dL (ref 7–25)
CALCIUM: 9.3 mg/dL (ref 8.6–10.3)
CHLORIDE: 105 mmol/L (ref 98–110)
CO2: 25 mmol/L (ref 20–31)
CREATININE: 1.01 mg/dL (ref 0.60–1.35)
GFR, Est African American: >89 mL/min (ref >=60)
GFR, Est Non African American: 88 mL/min (ref >=60)
GLUCOSE: 140 mg/dL — AB (ref 65–99)
Potassium: 4.3 mmol/L (ref 3.5–5.3)
Sodium: 139 mmol/L (ref 135–146)
TOTAL PROTEIN: 6.7 g/dL (ref 6.1–8.1)

## 2016-12-21 LAB — LIPID PANEL
Cholesterol: 209 mg/dL — ABNORMAL HIGH (ref <200)
HDL: 35 mg/dL — AB (ref >40)
LDL CALC: 139 mg/dL — AB (ref <100)
Total CHOL/HDL Ratio: 6 Ratio — ABNORMAL HIGH (ref <5.0)
Triglycerides: 173 mg/dL — ABNORMAL HIGH (ref <150)
VLDL: 35 mg/dL — AB (ref <30)

## 2016-12-21 LAB — POCT GLYCOSYLATED HEMOGLOBIN (HGB A1C): Hemoglobin A1C: 6.3

## 2016-12-21 MED ORDER — METOPROLOL TARTRATE 50 MG PO TABS: 50.0000 mg | ORAL_TABLET | Freq: Two times a day (BID) | ORAL | 3 refills | Status: DC

## 2016-12-21 MED ORDER — METFORMIN HCL 1000 MG PO TABS: 500.0000 mg | ORAL_TABLET | Freq: Every day | ORAL | 3 refills | Status: DC

## 2016-12-21 NOTE — Progress Notes (Addendum)
HPI: Barry Norton is a 46 y.o. male  who presents to [place] today, 12/22/16,  for chief complaint of:  Chief Complaint  Patient presents with  . Other    Switch from [place]/ diabetes    HTN - needs refill of Rx, no CP/SOB.   DM2 - A1C improvement with weight loss, he is still taking Metformin 1000 mg 1/2 tab daily and tolerating well  Gout - no flares in past year  No labs on file other than A1C which was last 7.1% resulted 9 mos ago  Past medical, surgical, social and family history reviewed: Patient Active Problem List   Diagnosis Date Noted  . Cardiac risk counseling 12/22/2016  . Type 2 diabetes mellitus without complication, without long-term current use of insulin ([place]) 10/12/2015  . Gout 06/27/2015  . Essential hypertension 06/27/2015   Past Surgical History:  Procedure Laterality Date  . ANKLE FRACTURE SURGERY  1988   left   Social History  Substance Use Topics  . Smoking status: Former Smoker    Quit date: 11/02/1995  . Smokeless tobacco: Never Used  . Alcohol use 0.0 oz/week   Family History  Problem Relation Age of Onset  . Cervical cancer Mother      Current medication list and allergy/intolerance information reviewed:   Current Outpatient Prescriptions  Medication Sig Dispense Refill  . metoprolol (LOPRESSOR) 50 MG tablet Take 1 tablet (50 mg total) by mouth 2 (two) times daily. 90 tablet 3  . atorvastatin (LIPITOR) 40 MG tablet Take 1 tablet (40 mg total) by mouth daily. 90 tablet 3  . metFORMIN (GLUCOPHAGE) 1000 MG tablet Take 0.5 tablets (500 mg total) by mouth daily. One tablet by mouth every evening for blood sugar control. 45 tablet 3   No current facility-administered medications for this visit.    Allergies  Allergen Reactions  . Lisinopril     cough  . Metformin And Related     diarrhea      Review of Systems:  Constitutional:  No  fever, no chills, No recent illness, No unintentional weight  changes. No significant fatigue.   Cardiac: No  chest pain, No  pressure, No palpitations  Respiratory:  No  shortness of breath. No  Cough  Gastrointestinal: No  abdominal pain, No  nausea, No  vomiting,  No  blood in stool, No  diarrhea  Musculoskeletal: No new myalgia/arthralgia  Endocrine: No polyuria/polydipsia/polyphagia   Psychiatric: No  concerns with depression, No  concerns with anxiety, No sleep problems, No mood problems  Exam:  BP 131/69   Pulse 62   Ht 6' (1.829 m)   Wt 248 lb (112.5 kg)   BMI 33.63 kg/m   Constitutional: VS see above. General Appearance: alert, well-developed, well-nourished, NAD  Eyes: Normal lids and conjunctive, non-icteric sclera  Ears, Nose, Mouth, Throat: MMM, Normal external inspection ears/nares/mouth/lips/gums. T  Neck: No masses, trachea midline. No thyroid enlargement. No tenderness/mass appreciated. No lymphadenopathy  Respiratory: Normal respiratory effort. no wheeze, no rhonchi, no rales  Cardiovascular: S1/S2 normal, no murmur, no rub/gallop auscultated. RRR. No lower extremity edema.   Musculoskeletal: Gait normal. No clubbing/cyanosis of digits.   Neurological: Normal balance/coordination. No tremor.   Skin: warm, dry, intact.  Psychiatric: Normal judgment/insight. Normal mood and affect. Oriented x3.    Results for orders placed or performed in visit on 12/21/16 (from the past 72 hour(s))  POCT HgB A1C     Status: None   Collection Time:  12/21/16  8:49 AM  Result Value Ref Range   Hemoglobin A1C 6.3   CBC with Differential/Platelet     Status: None   Collection Time: 12/21/16  8:54 AM  Result Value Ref Range   WBC 6.1 3.8 - 10.8 K/uL   RBC 4.62 4.20 - 5.80 MIL/uL   Hemoglobin 14.1 13.2 - 17.1 g/dL   HCT 41.5 38.5 - 50.0 %   MCV 89.8 80.0 - 100.0 fL   MCH 30.5 27.0 - 33.0 pg   MCHC 34.0 32.0 - 36.0 g/dL   RDW 13.2 11.0 - 15.0 %   Platelets 180 140 - 400 K/uL   MPV 9.8 7.5 - 12.5 fL   Neutro Abs 3,721 1,500 -  7,800 cells/uL   Lymphs Abs 1,830 850 - 3,900 cells/uL   Monocytes Absolute 366 200 - 950 cells/uL   Eosinophils Absolute 183 15 - 500 cells/uL   Basophils Absolute 0 0 - 200 cells/uL   Neutrophils Relative % 61 %   Lymphocytes Relative 30 %   Monocytes Relative 6 %   Eosinophils Relative 3 %   Basophils Relative 0 %   Smear Review Criteria for review not met   COMPLETE METABOLIC PANEL WITH GFR     Status: Abnormal   Collection Time: 12/21/16  8:54 AM  Result Value Ref Range   Sodium 139 135 - 146 mmol/L   Potassium 4.3 3.5 - 5.3 mmol/L   Chloride 105 98 - 110 mmol/L   CO2 25 20 - 31 mmol/L   Glucose, Bld 140 (H) 65 - 99 mg/dL   BUN 22 7 - 25 mg/dL   Creat 1.01 0.60 - 1.35 mg/dL   Total Bilirubin 0.9 0.2 - 1.2 mg/dL   Alkaline Phosphatase 55 40 - 115 U/L   AST 17 10 - 40 U/L   ALT 25 9 - 46 U/L   Total Protein 6.7 6.1 - 8.1 g/dL   Albumin 4.2 3.6 - 5.1 g/dL   Calcium 9.3 8.6 - 10.3 mg/dL   GFR, Est African American >89 >=60 mL/min   GFR, Est Non African American 88 >=60 mL/min  Lipid panel     Status: Abnormal   Collection Time: 12/21/16  8:54 AM  Result Value Ref Range   Cholesterol 209 (H) <200 mg/dL   Triglycerides 173 (H) <150 mg/dL   HDL 35 (L) >40 mg/dL   Total CHOL/HDL Ratio 6.0 (H) <5.0 Ratio   VLDL 35 (H) <30 mg/dL   LDL Cholesterol 139 (H) <100 mg/dL      ASSESSMENT/PLAN:   Type 2 diabetes mellitus without complication, without long-term current use of insulin (HCC) - Plan: POCT HgB A1C, HM Diabetes Foot Exam, CBC with Differential/Platelet, COMPLETE METABOLIC PANEL WITH GFR, Lipid panel, metFORMIN (GLUCOPHAGE) 1000 MG tablet, atorvastatin (LIPITOR) 40 MG tablet  Essential hypertension - Plan: metoprolol (LOPRESSOR) 50 MG tablet, CBC with Differential/Platelet, COMPLETE METABOLIC PANEL WITH GFR, Lipid panel  History of gout  Hyperlipidemia associated with type 2 diabetes mellitus (HCC) - Plan: atorvastatin (LIPITOR) 40 MG tablet  Cardiac risk counseling  - ASCVD 10-year risk 9.4% sa of 12/21/16    Visit summary with medication list and pertinent instructions was printed for patient to review. All questions at time of visit were answered - patient instructed to contact office with any additional concerns. ER/RTC precautions were reviewed with the patient. Follow-up plan: Return in about 6 months (around 06/23/2017) for ANNUAL PHYSICAL, SOONER IF NEEDED.

## 2016-12-22 DIAGNOSIS — Z7189 Other specified counseling: Secondary | ICD-10-CM | POA: Insufficient documentation

## 2016-12-22 MED ORDER — ATORVASTATIN CALCIUM 40 MG PO TABS: 40.0000 mg | ORAL_TABLET | Freq: Every day | ORAL | 3 refills | Status: DC

## 2016-12-22 NOTE — Addendum Note (Signed)
Addended by: Maryla Morrow on: 12/22/2016 08:54 AM   Modules accepted: Orders

## 2016-12-24 ENCOUNTER — Other Ambulatory Visit: Payer: Self-pay

## 2016-12-24 DIAGNOSIS — E119 Type 2 diabetes mellitus without complications: Secondary | ICD-10-CM

## 2016-12-24 MED ORDER — METFORMIN HCL 1000 MG PO TABS: 500.0000 mg | ORAL_TABLET | Freq: Every day | ORAL | 3 refills | Status: DC

## 2016-12-24 NOTE — Telephone Encounter (Signed)
Patient request a 90 day supply of Metformin.Netasha Wehrli,CMA

## 2017-03-08 ENCOUNTER — Other Ambulatory Visit: Payer: Self-pay

## 2017-03-08 DIAGNOSIS — I10 Essential (primary) hypertension: Secondary | ICD-10-CM

## 2017-03-08 MED ORDER — METOPROLOL TARTRATE 50 MG PO TABS
50.0000 mg | ORAL_TABLET | Freq: Two times a day (BID) | ORAL | 3 refills | Status: DC
Start: 2017-03-08 — End: 2018-02-24

## 2018-02-24 ENCOUNTER — Other Ambulatory Visit: Payer: Self-pay

## 2018-02-24 DIAGNOSIS — I10 Essential (primary) hypertension: Secondary | ICD-10-CM

## 2018-02-24 MED ORDER — METOPROLOL TARTRATE 50 MG PO TABS
50.0000 mg | ORAL_TABLET | Freq: Two times a day (BID) | ORAL | 0 refills | Status: DC
Start: 2018-02-24 — End: 2018-03-15

## 2018-02-24 NOTE — Telephone Encounter (Signed)
Pt has appt with Dr Sheppard Coil scheduled for 03-13-18 and will not have enough Metoprolol to last.   Advised pt I will send in just enough to last until his appt and he must keep appt.  Pt agreeable. No further needs at this time.

## 2018-03-13 ENCOUNTER — Ambulatory Visit: Payer: Self-pay | Admitting: Osteopathic Medicine

## 2018-03-14 ENCOUNTER — Telehealth: Payer: Self-pay

## 2018-03-14 ENCOUNTER — Ambulatory Visit: Payer: Self-pay | Admitting: Osteopathic Medicine

## 2018-03-14 NOTE — Telephone Encounter (Signed)
Pt called to reschedule at 8:30 on the morning of his 10:10 appt. Pt states that a co-worker had a family emergency and he had to go into the office.   Pt rescheduled for 10:50 tomorrow.   FYI to Dr Sheppard Coil

## 2018-03-14 NOTE — Telephone Encounter (Signed)
Noted He has had one no-show If late rescheduling becomes a pattern, will address w/ patient

## 2018-03-15 ENCOUNTER — Ambulatory Visit (INDEPENDENT_AMBULATORY_CARE_PROVIDER_SITE_OTHER): Payer: 59 | Admitting: Osteopathic Medicine

## 2018-03-15 ENCOUNTER — Encounter: Payer: Self-pay | Admitting: Osteopathic Medicine

## 2018-03-15 VITALS — BP 125/72 | HR 46 | Temp 98.3°F | Wt 250.9 lb

## 2018-03-15 DIAGNOSIS — E1169 Type 2 diabetes mellitus with other specified complication: Secondary | ICD-10-CM

## 2018-03-15 DIAGNOSIS — R001 Bradycardia, unspecified: Secondary | ICD-10-CM

## 2018-03-15 DIAGNOSIS — E785 Hyperlipidemia, unspecified: Secondary | ICD-10-CM

## 2018-03-15 DIAGNOSIS — I10 Essential (primary) hypertension: Secondary | ICD-10-CM | POA: Diagnosis not present

## 2018-03-15 DIAGNOSIS — E119 Type 2 diabetes mellitus without complications: Secondary | ICD-10-CM | POA: Diagnosis not present

## 2018-03-15 LAB — POCT GLYCOSYLATED HEMOGLOBIN (HGB A1C): Hemoglobin A1C: 6.9 % — AB (ref 4.0–5.6)

## 2018-03-15 MED ORDER — METOPROLOL TARTRATE 50 MG PO TABS: 25.0000 mg | ORAL_TABLET | Freq: Two times a day (BID) | ORAL | 0 refills | Status: DC

## 2018-03-15 MED ORDER — LOSARTAN POTASSIUM 25 MG PO TABS: 25.0000 mg | ORAL_TABLET | Freq: Every day | ORAL | 1 refills | Status: DC

## 2018-03-15 MED ORDER — METFORMIN HCL 1000 MG PO TABS: 1000.0000 mg | ORAL_TABLET | Freq: Every day | ORAL | 1 refills | Status: DC

## 2018-03-15 NOTE — Patient Instructions (Signed)
Stopping Metoprolol and starting Losartan Increasing metformin a bit  Consider atorvastatin for heart attack and stroke prevention

## 2018-03-15 NOTE — Progress Notes (Signed)
HPI: Barry Norton is a 47 y.o. male who  has a past medical history of Gout and Hypertension.  he presents to [facility] today, 03/15/18,  for chief complaint of:  Follow up DM2, HTN, HLD  DM2:  10/10/15: 8.6 03/18/16: 7.1 12/21/16: 6.3 03/15/18: 6.9 - he is on metformin 500 mg daily.  Tolerating medication well, no hypoglycemia.  HLD: Started statin d/t ASCVD risk and DM2 history. He's not taking it.  Dates working on diet/exercise and would like to avoid cholesterol medicine.  We had a discussion about the benefits of statins as far as cardiovascular risk reduction particularly in patients with diabetes, he would like to hold off on medicines for now. 12/21/17: TC 209, LDL 139, TG 173   HTN:  125/72 today, looks good! No CP/SOB her pulse is certainly low.  He reports occasional episodes of dizziness and feeling his heart rate is a little bit low.  A cardiologist at one point put him on metoprolol due to palpitations but he states this was after he had an episode from may be drinking a huge energy drink.  Had also been lisinopril in the past but this caused some coughing issues after a few months.     Past medical history, surgical history, and family history reviewed.  Current medication list and allergy/intolerance information reviewed.   (See remainder of HPI, ROS, Phys Exam below)  No results found.  No results found for this or any previous visit (from the past 72 hour(s)).   ASSESSMENT/PLAN:   Type 2 diabetes mellitus without complication, without long-term current use of insulin (HCC) - Plan: POCT HgB A1C, metFORMIN (GLUCOPHAGE) 1000 MG tablet  Essential hypertension - Plan: metoprolol tartrate (LOPRESSOR) 50 MG tablet  Hyperlipidemia associated with type 2 diabetes mellitus (HCC) - Clines statin, see above  Bradycardia - Most likely due to beta-blockade, trial transition off metoprolol to ARB, providing renal protection as well for  diabetic patient   Meds ordered this encounter  Medications  . metFORMIN (GLUCOPHAGE) 1000 MG tablet    Sig: Take 1 tablet (1,000 mg total) by mouth daily.    Dispense:  90 tablet    Refill:  1  . metoprolol tartrate (LOPRESSOR) 50 MG tablet    Sig: Take 0.5 tablets (25 mg total) by mouth 2 (two) times daily. For one week then stop, and start the other medicine    Dispense:  1 tablet    Refill:  0  . losartan (COZAAR) 25 MG tablet    Sig: Take 1 tablet (25 mg total) by mouth daily.    Dispense:  90 tablet    Refill:  1    Patient Instructions  Stopping Metoprolol and starting Losartan Increasing metformin a bit  Consider atorvastatin for heart attack and stroke prevention    Follow-up plan: Return in about 2 weeks (around 03/29/2018) for recheck BP and pulse w/ nurse, if BP ok can see Dr A in 3 mos to recheeck A1C .     ############################################ ############################################ ############################################ ############################################    Outpatient Encounter Medications as of 03/15/2018  Medication Sig  . metFORMIN (GLUCOPHAGE) 1000 MG tablet Take 0.5 tablets (500 mg total) by mouth daily. One tablet by mouth every evening for blood sugar control.  . metoprolol tartrate (LOPRESSOR) 50 MG tablet Take 1 tablet (50 mg total) by mouth 2 (two) times daily.  Marland Kitchen atorvastatin (LIPITOR) 40 MG tablet Take 1 tablet (40 mg total) by mouth daily. (Patient not  taking: Reported on 03/15/2018)   No facility-administered encounter medications on file as of 03/15/2018.    Allergies  Allergen Reactions  . Lisinopril     cough  . Metformin And Related     diarrhea      Review of Systems:  Constitutional: No recent illness  HEENT: No  headache, no vision change  Cardiac: No  chest pain, No  pressure, No palpitations  Respiratory:  No  shortness of breath. No  Cough  Gastrointestinal: No  abdominal pain, no change on  bowel habits  Neurologic: No  weakness, +Dizziness  Psychiatric: No  concerns with depression, No  concerns with anxiety  Exam:  BP 125/72 (BP Location: Left Arm, Patient Position: Sitting, Cuff Size: Normal)   Pulse (!) 46   Temp 98.3 F (36.8 C) (Oral)   Wt 250 lb 14.4 oz (113.8 kg)   BMI 34.03 kg/m   Constitutional: VS see above. General Appearance: alert, well-developed, well-nourished, NAD  Eyes: Normal lids and conjunctive, non-icteric sclera  Ears, Nose, Mouth, Throat: MMM, Normal external inspection ears/nares/mouth/lips/gums.  Neck: No masses, trachea midline.   Respiratory: Normal respiratory effort. no wheeze, no rhonchi, no rales  Cardiovascular: S1/S2 normal, no murmur, no rub/gallop auscultated.  Bradycardic with regular rhythm.   Musculoskeletal: Gait normal. Symmetric and independent movement of all extremities  Neurological: Normal balance/coordination. No tremor.  Skin: warm, dry, intact.   Psychiatric: Normal judgment/insight. Normal mood and affect. Oriented x3.   Visit summary with medication list and pertinent instructions was printed for patient to review, advised to alert [place] if any changes needed. All questions at time of visit were answered - patient instructed to contact office with any additional concerns. ER/RTC precautions were reviewed with the patient and understanding verbalized.   Follow-up plan: Return in about 2 weeks (around 03/29/2018) for recheck BP and pulse w/ nurse, if BP ok can see Dr A in 3 mos to recheeck A1C .  Note: Total time spent 25 minutes, greater than 50% of the visit was spent face-to-face counseling and coordinating care for the following: The primary encounter diagnosis was Type 2 diabetes mellitus without complication, without long-term current use of insulin ([place]). Diagnoses of Essential hypertension, Hyperlipidemia associated with type 2 diabetes mellitus ([place]), and Bradycardia were also pertinent to this  visit.Marland Kitchen  Please note: voice recognition software was used to produce this document, and typos may escape review. Please contact Dr. Sheppard Coil for any needed clarifications.

## 2018-06-14 ENCOUNTER — Encounter: Payer: Self-pay | Admitting: Osteopathic Medicine

## 2018-06-14 ENCOUNTER — Ambulatory Visit (INDEPENDENT_AMBULATORY_CARE_PROVIDER_SITE_OTHER): Payer: Self-pay | Admitting: Osteopathic Medicine

## 2018-06-14 VITALS — BP 145/90 | HR 59 | Temp 98.2°F | Wt 256.3 lb

## 2018-06-14 DIAGNOSIS — Z7189 Other specified counseling: Secondary | ICD-10-CM

## 2018-06-14 DIAGNOSIS — E119 Type 2 diabetes mellitus without complications: Secondary | ICD-10-CM

## 2018-06-14 DIAGNOSIS — Z8739 Personal history of other diseases of the musculoskeletal system and connective tissue: Secondary | ICD-10-CM

## 2018-06-14 DIAGNOSIS — E785 Hyperlipidemia, unspecified: Secondary | ICD-10-CM

## 2018-06-14 DIAGNOSIS — I10 Essential (primary) hypertension: Secondary | ICD-10-CM

## 2018-06-14 DIAGNOSIS — E1169 Type 2 diabetes mellitus with other specified complication: Secondary | ICD-10-CM

## 2018-06-14 LAB — POCT GLYCOSYLATED HEMOGLOBIN (HGB A1C): Hemoglobin A1C: 6.8 % — AB (ref 4.0–5.6)

## 2018-06-14 MED ORDER — METOPROLOL SUCCINATE ER 25 MG PO TB24: 25.0000 mg | ORAL_TABLET | Freq: Every day | ORAL | 3 refills | Status: DC

## 2018-06-14 MED ORDER — METFORMIN HCL 1000 MG PO TABS
1000.0000 mg | ORAL_TABLET | Freq: Every day | ORAL | 3 refills | Status: DC
Start: 2018-06-14 — End: 2019-09-08

## 2018-06-14 NOTE — Progress Notes (Signed)
HPI: Barry Norton is a 47 y.o. male who  has a past medical history of Gout and Hypertension.  he presents to [facility] today, 06/14/18,  for chief complaint of: Follow up DM2, HTN, HLD  DM2:  10/10/15: 8.6 03/18/16: 7.1 12/21/16: 6.3 03/15/18: 6.9 - he was on metformin 500 mg daily.  Tolerating medication well, no hypoglycemia. We increased to 1000 mg daily.  Today, 06/14/18: 6.8 - has only bee non 1/2 tablet of the 1000 mg metformin  Wt Readings from Last 3 Encounters:  06/14/18 256 lb 4.8 oz (116.3 kg)  03/15/18 250 lb 14.4 oz (113.8 kg)  12/21/16 248 lb (112.5 kg)     HLD: Started statin d/t ASCVD risk and DM2 history. Has not been taking it.  He reports e is working on diet/exercise and would like to avoid cholesterol medicine.  We again had a discussion about the benefits of statins as far as cardiovascular risk reduction particularly in patients with diabetes, he would like to hold off on medicines for now. 12/21/17: TC 209, LDL 139, TG 173   HTN:  125/72 last visit, but pulse was low last visit so we tapered off BB and started Losartan.  A cardiologist at one point put him on metoprolol due to palpitations but he states this was after he had an episode from may be drinking a huge energy drink.  Had also been lisinopril in the past but this caused some coughing issues after a few months. At any rate, renal protection was also a factor, so started ARB.  He has since stopped the losartan due to concerns for dizziness, erectile dysfunction.  He had some leftover metoprolol so he has been taking this at half dose.   BP elevated today since he hasn't taken meds today.       Past medical history, surgical history, and family history reviewed.  Current medication list and allergy/intolerance information reviewed.   (See remainder of HPI, ROS, Phys Exam below)    Results for orders placed or performed in visit on 06/14/18 (from the past 72  hour(s))  POCT HgB A1C     Status: Abnormal   Collection Time: 06/14/18 10:25 AM  Result Value Ref Range   Hemoglobin A1C 6.8 (A) 4.0 - 5.6 %   HbA1c POC (<> result, manual entry)     HbA1c, POC (prediabetic range)     HbA1c, POC (controlled diabetic range)       ASSESSMENT/PLAN: The primary encounter diagnosis was Type 2 diabetes mellitus without complication, without long-term current use of insulin ([place]). Diagnoses of Essential hypertension, Hyperlipidemia associated with type 2 diabetes mellitus ([place]), History of gout, and Cardiac risk counseling were also pertinent to this visit.    Meds ordered this encounter  Medications  . metoprolol succinate (TOPROL-XL) 25 MG 24 hr tablet    Sig: Take 1 tablet (25 mg total) by mouth daily. Take with or immediately following a meal.    Dispense:  90 tablet    Refill:  3  . metFORMIN (GLUCOPHAGE) 1000 MG tablet    Sig: Take 1 tablet (1,000 mg total) by mouth daily.    Dispense:  90 tablet    Refill:  3    Follow-up plan: Return in about 3 months (around 09/13/2018) for recheck labs ahead of visit to follow up on diabetes, BP, cholesterol .                    ############################################ ############################################ ############################################ ############################################  Outpatient Encounter Medications as of 06/14/2018  Medication Sig  . losartan (COZAAR) 25 MG tablet Take 1 tablet (25 mg total) by mouth daily.  . metFORMIN (GLUCOPHAGE) 1000 MG tablet Take 1 tablet (1,000 mg total) by mouth daily.  . metoprolol tartrate (LOPRESSOR) 50 MG tablet Take 0.5 tablets (25 mg total) by mouth 2 (two) times daily. For one week then stop, and start the other medicine   No facility-administered encounter medications on file as of 06/14/2018.    Allergies  Allergen Reactions  . Lisinopril     cough  . Metformin And Related     diarrhea      Review of  Systems:  Constitutional: No recent illness  HEENT: No  headache, no vision change  Cardiac: No  chest pain, No  pressure, No palpitations  Respiratory:  No  shortness of breath. No  Cough  Gastrointestinal: No  abdominal pain  Musculoskeletal: No new myalgia/arthralgia  Skin: No  Rash  Neurologic: No  weakness, No  Dizziness  Psychiatric: No  concerns with depression, No  concerns with anxiety  Exam:  BP (!) 145/90 (BP Location: Left Arm, Patient Position: Sitting, Cuff Size: Normal)   Pulse (!) 59   Temp 98.2 F (36.8 C) (Oral)   Wt 256 lb 4.8 oz (116.3 kg)   BMI 34.76 kg/m   Constitutional: VS see above. General Appearance: alert, well-developed, well-nourished, NAD  Eyes: Normal lids and conjunctive, non-icteric sclera  Ears, Nose, Mouth, Throat: MMM, Normal external inspection ears/nares/mouth/lips/gums.  Neck: No masses, trachea midline.   Respiratory: Normal respiratory effort. no wheeze, no rhonchi, no rales  Cardiovascular: S1/S2 normal, no murmur, no rub/gallop auscultated. RRR.   Musculoskeletal: Gait normal. Symmetric and independent movement of all extremities  Neurological: Normal balance/coordination. No tremor.  Skin: warm, dry, intact.   Psychiatric: Normal judgment/insight. Normal mood and affect. Oriented x3.   Visit summary with medication list and pertinent instructions was printed for patient to review, advised to alert [place] if any changes needed. All questions at time of visit were answered - patient instructed to contact office with any additional concerns. ER/RTC precautions were reviewed with the patient and understanding verbalized.   Follow-up plan: Return in about 3 months (around 09/13/2018) for recheck labs ahead of visit to follow up on diabetes, BP, cholesterol .    Please note: voice recognition software was used to produce this document, and typos may escape review. Please contact Dr. Sheppard Coil for any needed clarifications.

## 2018-09-13 ENCOUNTER — Ambulatory Visit: Payer: Self-pay | Admitting: Osteopathic Medicine

## 2018-09-13 ENCOUNTER — Telehealth: Payer: Self-pay | Admitting: Osteopathic Medicine

## 2018-09-13 NOTE — Telephone Encounter (Signed)
Please call patient: he did not show to his appointment today. This is his sedond second no-show in the past year, technically this third since he called late to reschedule once as well. Please call to make sure everything is ok and see if he would like to reschedule.   Please remind him of clinic policy that 3 no-shows in 1 year will result in dismissal from the practice, to protect access to appointments for all our patients. If ne no-shows again, he will be dismissed from the practice.    LATE RESCHEDULE called 8:30 for 10:10 appt 03/14/18 - had to go in to work unexpectedly d/t coworker being out NO SHOW 03/13/18 for f/u w/ Dr Loni Muse  NO SHOW for DM2 follow-up 09/13/18 w/ Dr Loni Muse

## 2018-09-13 NOTE — Telephone Encounter (Signed)
Routing to scheduler for completion.

## 2018-09-13 NOTE — Telephone Encounter (Signed)
Called patient to inform him of information from [place] and that appointment was missed today. States that everything is okay, it slipped his mind, but he will have to call back to reschedule.

## 2019-05-19 ENCOUNTER — Other Ambulatory Visit: Payer: Self-pay | Admitting: Osteopathic Medicine

## 2019-05-21 NOTE — Telephone Encounter (Signed)
Must make appointment before any further refills

## 2019-05-24 ENCOUNTER — Other Ambulatory Visit: Payer: Self-pay | Admitting: Osteopathic Medicine

## 2019-05-24 MED ORDER — METOPROLOL SUCCINATE ER 25 MG PO TB24: 25.0000 mg | ORAL_TABLET | Freq: Every day | ORAL | 0 refills | Status: DC

## 2019-05-24 NOTE — Telephone Encounter (Signed)
Patient metoprolol succinate (TOPROL-XL) 25 MG 24 hr tablet [id number] needs a refill on this medication. Patient only has a week left. Please advise.

## 2019-05-24 NOTE — Addendum Note (Signed)
Addended by: Doree Albee on: 05/24/2019 02:24 PM   Modules accepted: Orders

## 2019-06-14 ENCOUNTER — Encounter: Payer: Self-pay | Admitting: Osteopathic Medicine

## 2019-06-14 ENCOUNTER — Other Ambulatory Visit: Payer: Self-pay

## 2019-06-14 ENCOUNTER — Ambulatory Visit (INDEPENDENT_AMBULATORY_CARE_PROVIDER_SITE_OTHER): Payer: BLUE CROSS/BLUE SHIELD | Admitting: Osteopathic Medicine

## 2019-06-14 VITALS — BP 138/84 | HR 76 | Temp 98.6°F | Wt 250.4 lb

## 2019-06-14 DIAGNOSIS — I10 Essential (primary) hypertension: Secondary | ICD-10-CM | POA: Diagnosis not present

## 2019-06-14 DIAGNOSIS — E119 Type 2 diabetes mellitus without complications: Secondary | ICD-10-CM | POA: Diagnosis not present

## 2019-06-14 DIAGNOSIS — Z0000 Encounter for general adult medical examination without abnormal findings: Secondary | ICD-10-CM

## 2019-06-14 DIAGNOSIS — Z1211 Encounter for screening for malignant neoplasm of colon: Secondary | ICD-10-CM

## 2019-06-14 DIAGNOSIS — E1169 Type 2 diabetes mellitus with other specified complication: Secondary | ICD-10-CM

## 2019-06-14 DIAGNOSIS — Z8739 Personal history of other diseases of the musculoskeletal system and connective tissue: Secondary | ICD-10-CM

## 2019-06-14 DIAGNOSIS — E785 Hyperlipidemia, unspecified: Secondary | ICD-10-CM

## 2019-06-14 NOTE — Progress Notes (Signed)
HPI: Barry Norton is a 48 y.o. male who  has a past medical history of Gout and Hypertension.  he presents to [facility] today, 06/14/19,  for chief complaint of: Annual physical     Patient here for annual physical / wellness exam.  See preventive care reviewed as below.    Additional concerns today include:  None     Past medical, surgical, social and family history reviewed:  Patient Active Problem List   Diagnosis Date Noted  . Cardiac risk counseling 12/22/2016  . Type 2 diabetes mellitus without complication, without long-term current use of insulin ([place]) 10/12/2015  . Gout 06/27/2015  . Essential hypertension 06/27/2015    Past Surgical History:  Procedure Laterality Date  . ANKLE FRACTURE SURGERY  1988   left    Social History   Tobacco Use  . Smoking status: Former Smoker    Quit date: 11/02/1995    Years since quitting: 23.6  . Smokeless tobacco: Never Used  Substance Use Topics  . Alcohol use: Yes    Alcohol/week: 0.0 standard drinks    Family History  Problem Relation Age of Onset  . Cervical cancer Mother      Current medication list and allergy/intolerance information reviewed:    Current Outpatient Medications  Medication Sig Dispense Refill  . metFORMIN (GLUCOPHAGE) 1000 MG tablet Take 1 tablet (1,000 mg total) by mouth daily. 90 tablet 3  . metoprolol succinate (TOPROL-XL) 25 MG 24 hr tablet Take 1 tablet (25 mg total) by mouth daily. Take with or immediately following a meal.Must make appointment before any further refills 30 tablet 0  . metoprolol tartrate (LOPRESSOR) 50 MG tablet Take 0.5 tablets (25 mg total) by mouth 2 (two) times daily. For one week then stop, and start the other medicine (Patient not taking: Reported on 06/14/2019) 1 tablet 0   No current facility-administered medications for this visit.     Allergies  Allergen Reactions  . Lisinopril     cough  . Metformin And Related    diarrhea      Review of Systems:  Constitutional:  No  fever, no chills, No recent illness, No unintentional weight changes. No significant fatigue.   HEENT: No  headache, no vision change, no hearing change, No sore throat, No  sinus pressure  Cardiac: No  chest pain, No  pressure, No palpitations, No  Orthopnea  Respiratory:  No  shortness of breath. No  Cough  Gastrointestinal: No  abdominal pain, No  nausea, No  vomiting,  No  blood in stool, No  diarrhea, No  constipation   Musculoskeletal: No new myalgia/arthralgia  Skin: No  Rash, No other wounds/concerning lesions  Genitourinary: No  incontinence, No  abnormal genital bleeding, No abnormal genital discharge  Neurologic: No  weakness, No  dizziness  Psychiatric: No  concerns with depression, No  concerns with anxiety, No sleep problems, No mood problems  Exam:  BP 138/84 (BP Location: Left Arm, Patient Position: Sitting, Cuff Size: Normal)   Pulse 76   Temp 98.6 F (37 C) (Oral)   Wt 250 lb 6.4 oz (113.6 kg)   BMI 33.96 kg/m   Constitutional: VS see above. General Appearance: alert, well-developed, well-nourished, NAD  Eyes: Normal lids and conjunctive, non-icteric sclera  Ears, Nose, Mouth, Throat:. TM normal bilaterall  Neck: No masses, trachea midline. No thyroid enlargement. No tenderness/mass appreciated. No lymphadenopathy  Respiratory: Normal respiratory effort. no wheeze, no rhonchi, no rales  Cardiovascular:  S1/S2 normal, no murmur, no rub/gallop auscultated. RRR. No lower extremity edema.   Gastrointestinal: Nontender, no masses. No hepatomegaly, no splenomegaly. No hernia appreciated. Bowel sounds normal. Rectal exam deferred.   Musculoskeletal: Gait normal. No clubbing/cyanosis of digits.   Neurological: Normal balance/coordination. No tremor.   Skin: warm, dry, intact. No rash/ulcer. No concerning nevi or subq nodules on limited exam.    Psychiatric: Normal judgment/insight. Normal mood and  affect. Oriented x3.    No results found for this or any previous visit (from the past 72 hour(s)).  No results found.   ASSESSMENT/PLAN: The primary encounter diagnosis was Annual physical exam. Diagnoses of Type 2 diabetes mellitus without complication, without long-term current use of insulin ([place]), Essential hypertension, Hyperlipidemia associated with type 2 diabetes mellitus ([place]), History of gout, and Screening for malignant neoplasm of colon were also pertinent to this visit.   Patient declines flu vaccine.  He like to check with insurance about cost of tetanus booster.  Patient was advised on reasons to get a booster sooner due to injury.  Gust colonoscopy versus Cologuard, patient prefers colonoscopy, I placed referral for when he is 73 in a couple of months  Orders Placed This Encounter  Procedures  . CBC  . COMPLETE METABOLIC PANEL WITH GFR  . Lipid panel  . Hemoglobin A1c  . Uric acid  . Ambulatory referral to Gastroenterology    No orders of the defined types were placed in this encounter.   Patient Instructions  General Preventive Care  Most recent routine screening lipids/other labs: ordered today   Everyone should have blood pressure checked once per year. Goal 130/80 or under this   Tobacco: don't!   Alcohol: responsible moderation is ok for most adults - if you have concerns about your alcohol intake, please talk to me!    Exercise: as tolerated to reduce risk of cardiovascular disease and diabetes. Strength training will also prevent osteoporosis.   Mental health: if need for mental health care (medicines, counseling, other), or concerns about moods, please let me know!   Sexual health: if need for STD testing, or if concerns with libido/pain problems, please let me know!  Advanced Directive: Living Will and/or Healthcare Power of Attorney recommended for all adults, regardless of age or health.  Vaccines  Flu vaccine: recommended for almost  everyone, every fall.   Shingles vaccine: Shingrix recommended after age 56.   Pneumonia vaccines: Prevnar and Pneumovax recommended after age 33, or sooner if certain medical conditions.  Tetanus booster: Tdap recommended every 10 years.  Cancer screenings   Colon cancer screening: recommended for everyone at age 80   Prostate cancer screening: PSA blood test around age 53  Lung cancer screening: not needed since you quit a long time ago!  Infection screenings . HIV: recommended screening at least once age 62-65, more often as needed. . Gonorrhea/Chlamydia: screening as needed.  . Hepatitis C: recommended for anyone born 79-1967 (not needed for you!) . TB: certain at-risk populations, or depending on work requirements and/or travel history Other . Bone Density Test: recommended for men at age 58, sooner depending on risk factors . Abdominal Aortic Aneurysm: screening with ultrasound recommended once for men age 75-75 who have EVER smoked         Visit summary with medication list and pertinent instructions was printed for patient to review. All questions at time of visit were answered - patient instructed to contact office with any additional concerns or updates. ER/RTC precautions were  reviewed with the patient.     Please note: voice recognition software was used to produce this document, and typos may escape review. Please contact Dr. Sheppard Coil for any needed clarifications.     Follow-up plan: Return for RECHECK PENDING RESULTS - will call you! Marland Kitchen

## 2019-06-14 NOTE — Patient Instructions (Addendum)
General Preventive Care  Most recent routine screening lipids/other labs: ordered today   Everyone should have blood pressure checked once per year. Goal 130/80 or under this   Tobacco: don't!   Alcohol: responsible moderation is ok for most adults - if you have concerns about your alcohol intake, please talk to me!    Exercise: as tolerated to reduce risk of cardiovascular disease and diabetes. Strength training will also prevent osteoporosis.   Mental health: if need for mental health care (medicines, counseling, other), or concerns about moods, please let me know!   Sexual health: if need for STD testing, or if concerns with libido/pain problems, please let me know!  Advanced Directive: Living Will and/or Healthcare Power of Attorney recommended for all adults, regardless of age or health.  Vaccines  Flu vaccine: recommended for almost everyone, every fall.   Shingles vaccine: Shingrix recommended after age 54.   Pneumonia vaccines: Prevnar and Pneumovax recommended after age 68, or sooner if certain medical conditions.  Tetanus booster: Tdap recommended every 10 years.  Cancer screenings   Colon cancer screening: recommended for everyone at age 71   Prostate cancer screening: PSA blood test around age 62  Lung cancer screening: not needed since you quit a long time ago!  Infection screenings . HIV: recommended screening at least once age 34-65, more often as needed. . Gonorrhea/Chlamydia: screening as needed.  . Hepatitis C: recommended for anyone born 71-1967 (not needed for you!) . TB: certain at-risk populations, or depending on work requirements and/or travel history Other . Bone Density Test: recommended for men at age 29, sooner depending on risk factors . Abdominal Aortic Aneurysm: screening with ultrasound recommended once for men age 76-75 who have EVER smoked

## 2019-06-15 ENCOUNTER — Other Ambulatory Visit: Payer: Self-pay | Admitting: Osteopathic Medicine

## 2019-06-15 LAB — COMPLETE METABOLIC PANEL WITH GFR
AG Ratio: 1.9 (calc) (ref 1.0–2.5)
ALT: 25 U/L (ref 9–46)
AST: 21 U/L (ref 10–40)
Albumin: 4.9 g/dL (ref 3.6–5.1)
Alkaline phosphatase (APISO): 65 U/L (ref 36–130)
BUN: 18 mg/dL (ref 7–25)
CO2: 26 mmol/L (ref 20–32)
Calcium: 9.7 mg/dL (ref 8.6–10.3)
Chloride: 104 mmol/L (ref 98–110)
Creat: 0.94 mg/dL (ref 0.60–1.35)
GFR, Est African American: 110 mL/min/{1.73_m2} (ref >=60)
GFR, Est Non African American: 95 mL/min/{1.73_m2} (ref >=60)
Globulin: 2.6 g/dL (calc) (ref 1.9–3.7)
Glucose, Bld: 157 mg/dL — ABNORMAL HIGH (ref 65–99)
Potassium: 4.7 mmol/L (ref 3.5–5.3)
Sodium: 139 mmol/L (ref 135–146)
Total Bilirubin: 0.8 mg/dL (ref 0.2–1.2)
Total Protein: 7.5 g/dL (ref 6.1–8.1)

## 2019-06-15 LAB — CBC
HCT: 46.1 % (ref 38.5–50.0)
Hemoglobin: 15.5 g/dL (ref 13.2–17.1)
MCH: 30.1 pg (ref 27.0–33.0)
MCHC: 33.6 g/dL (ref 32.0–36.0)
MCV: 89.5 fL (ref 80.0–100.0)
MPV: 10.2 fL (ref 7.5–12.5)
Platelets: 198 10*3/uL (ref 140–400)
RBC: 5.15 10*6/uL (ref 4.20–5.80)
RDW: 12.9 % (ref 11.0–15.0)
WBC: 5.5 10*3/uL (ref 3.8–10.8)

## 2019-06-15 LAB — URIC ACID: Uric Acid, Serum: 7.5 mg/dL (ref 4.0–8.0)

## 2019-06-15 LAB — HEMOGLOBIN A1C
Hgb A1c MFr Bld: 7.3 % of total Hgb — ABNORMAL HIGH (ref <5.7)
Mean Plasma Glucose: 163 (calc)
eAG (mmol/L): 9 (calc)

## 2019-06-15 LAB — LIPID PANEL
Cholesterol: 254 mg/dL — ABNORMAL HIGH (ref <200)
HDL: 42 mg/dL (ref >=40)
LDL Cholesterol (Calc): 162 mg/dL (calc) — ABNORMAL HIGH
Non-HDL Cholesterol (Calc): 212 mg/dL (calc) — ABNORMAL HIGH (ref <130)
Total CHOL/HDL Ratio: 6 (calc) — ABNORMAL HIGH (ref <5.0)
Triglycerides: 324 mg/dL — ABNORMAL HIGH (ref <150)

## 2019-06-15 MED ORDER — ATORVASTATIN CALCIUM 20 MG PO TABS: 20.0000 mg | ORAL_TABLET | Freq: Every day | ORAL | 3 refills | Status: DC

## 2019-06-15 NOTE — Addendum Note (Signed)
Addended by: Maryla Morrow on: 06/15/2019 04:03 PM   Modules accepted: Orders

## 2019-08-08 ENCOUNTER — Encounter: Payer: Self-pay | Admitting: Osteopathic Medicine

## 2019-09-08 ENCOUNTER — Other Ambulatory Visit: Payer: Self-pay | Admitting: Osteopathic Medicine

## 2019-09-08 DIAGNOSIS — E119 Type 2 diabetes mellitus without complications: Secondary | ICD-10-CM

## 2019-10-10 ENCOUNTER — Ambulatory Visit: Payer: BLUE CROSS/BLUE SHIELD | Admitting: Osteopathic Medicine

## 2019-10-11 ENCOUNTER — Other Ambulatory Visit: Payer: Self-pay | Admitting: Osteopathic Medicine

## 2019-10-11 NOTE — Telephone Encounter (Signed)
Requested medication (s) are due for refill today: yes  Requested medication (s) are on the active medication list: yes  Last refill:  09/08/2019  Future visit scheduled:no  Notes to clinic: review for refill   Requested Prescriptions  Pending Prescriptions Disp Refills   metoprolol succinate (TOPROL-XL) 25 MG 24 hr tablet [Pharmacy Med Name: metoprolol succinate ER 25 mg tablet,extended release 24 hr] 30 tablet 0    Sig: Take 1 tablet (25 mg total) by mouth daily. Take with or immediately following a meal.Must make appointment before any further refills      Cardiovascular:  Beta Blockers Passed - 10/11/2019  8:17 AM      Passed - Last BP in normal range    BP Readings from Last 1 Encounters:  06/14/19 138/84          Passed - Last Heart Rate in normal range    Pulse Readings from Last 1 Encounters:  06/14/19 76          Passed - Valid encounter within last 6 months    Recent Outpatient Visits           3 months ago Annual physical exam   [facility], Lanelle Bal, DO   1 year ago Type 2 diabetes mellitus without complication, without long-term current use of insulin [facility])   [facility], Lanelle Bal, DO   1 year ago Type 2 diabetes mellitus without complication, without long-term current use of insulin [facility])   [facility], Lanelle Bal, DO   2 years ago Type 2 diabetes mellitus without complication, without long-term current use of insulin [facility]   [facility], Lanelle Bal, DO   3 years ago Essential hypertension   [facility], Tiburones, DO

## 2019-10-31 ENCOUNTER — Other Ambulatory Visit: Payer: Self-pay | Admitting: Osteopathic Medicine

## 2019-11-09 ENCOUNTER — Other Ambulatory Visit: Payer: Self-pay | Admitting: Osteopathic Medicine

## 2019-11-09 NOTE — Telephone Encounter (Signed)
Patient would love a refill on BP medications. States that he needs enough until next appointment. Please advise.

## 2019-11-12 MED ORDER — METOPROLOL SUCCINATE ER 25 MG PO TB24: 25.0000 mg | ORAL_TABLET | Freq: Every day | ORAL | 0 refills | Status: DC

## 2019-11-12 NOTE — Telephone Encounter (Signed)
Left brief message that his BP medication has been sent to the pharmacy. He was asked to call back with any questions.

## 2019-11-12 NOTE — Telephone Encounter (Signed)
Ok to refill 

## 2019-11-12 NOTE — Addendum Note (Signed)
Addended by: Dessie Coma on: 11/12/2019 10:09 AM   Modules accepted: Orders

## 2019-11-21 ENCOUNTER — Ambulatory Visit: Payer: BLUE CROSS/BLUE SHIELD | Admitting: Osteopathic Medicine

## 2019-12-12 ENCOUNTER — Ambulatory Visit (INDEPENDENT_AMBULATORY_CARE_PROVIDER_SITE_OTHER): Payer: BLUE CROSS/BLUE SHIELD | Admitting: Osteopathic Medicine

## 2019-12-12 ENCOUNTER — Other Ambulatory Visit: Payer: Self-pay

## 2019-12-12 ENCOUNTER — Encounter: Payer: Self-pay | Admitting: Osteopathic Medicine

## 2019-12-12 VITALS — BP 135/87 | HR 60 | Temp 98.2°F | Wt 248.0 lb

## 2019-12-12 DIAGNOSIS — E119 Type 2 diabetes mellitus without complications: Secondary | ICD-10-CM | POA: Diagnosis not present

## 2019-12-12 DIAGNOSIS — I10 Essential (primary) hypertension: Secondary | ICD-10-CM

## 2019-12-12 DIAGNOSIS — E1169 Type 2 diabetes mellitus with other specified complication: Secondary | ICD-10-CM | POA: Diagnosis not present

## 2019-12-12 DIAGNOSIS — E785 Hyperlipidemia, unspecified: Secondary | ICD-10-CM

## 2019-12-12 LAB — POCT GLYCOSYLATED HEMOGLOBIN (HGB A1C): Hemoglobin A1C: 6.6 % — AB (ref 4.0–5.6)

## 2019-12-12 MED ORDER — METOPROLOL SUCCINATE ER 25 MG PO TB24: 25.0000 mg | ORAL_TABLET | Freq: Every day | ORAL | 3 refills | Status: DC

## 2019-12-12 MED ORDER — METFORMIN HCL 1000 MG PO TABS: 1000.0000 mg | ORAL_TABLET | Freq: Every day | ORAL | 3 refills | Status: DC

## 2019-12-12 NOTE — Progress Notes (Signed)
Barry Norton is a 49 y.o. male who presents to  [place] at [facility]  today, 12/12/19, seeking care for the following: . DM2 - needs A1C  . HLD - needs lipids/CMP . HTN - BP check      ASSESSMENT & PLAN with other pertinent history/findings:  The primary encounter diagnosis was Type 2 diabetes mellitus without complication, without long-term current use of insulin ([place]). Diagnoses of Essential hypertension and Hyperlipidemia associated with type 2 diabetes mellitus ([place]) were also pertinent to this visit.   1. Type 2 diabetes mellitus without complication, without long-term current use of insulin ([facility]) A1C today 6.6 Last measured 06/14/19 at 7.3 [place]!  2. Essential hypertension BP Readings from Last 3 Encounters:  12/12/19 135/87  06/14/19 138/84  06/14/18 (!) 145/90  Refill meds   3. Hyperlipidemia associated with type 2 diabetes mellitus ([place]) Never started Lipitor!  06/14/19: LDL 162, TG 324  Discussed ASCVD risk, standard of care is to recommend statin    The 10-year ASCVD risk score Mikey Bussing DC Jr., et al., 2013) is: 13.9%   Values used to calculate the score:     Age: 51 years     Sex: Male     Is Non-Hispanic African American: No     Diabetic: Yes     Tobacco smoker: No     Systolic Blood Pressure: [id number] mmHg     Is BP treated: Yes     HDL Cholesterol: 42 mg/dL     Total Cholesterol: 254 mg/dL    There are no Patient Instructions on file for this visit.   Orders Placed This Encounter  Procedures  . Lipid panel  . POCT HgB A1C    Meds ordered this encounter  Medications  . metoprolol succinate (TOPROL-XL) 25 MG 24 hr tablet    Sig: Take 1 tablet (25 mg total) by mouth daily. Take with or immediately following a meal.    Dispense:  90 tablet    Refill:  3  . metFORMIN (GLUCOPHAGE) 1000 MG tablet    Sig: Take 1 tablet (1,000 mg total) by mouth daily.    Dispense:  90 tablet    Refill:  3        Follow-up instructions: Return in about 4 months (around 04/12/2020).                                         BP 135/87 (BP Location: Left Arm, Patient Position: Sitting, Cuff Size: Normal)   Pulse 60   Temp 98.2 F (36.8 C) (Oral)   Wt 248 lb 0.6 oz (112.5 kg)   BMI 33.64 kg/m   Current Meds  Medication Sig  . metFORMIN (GLUCOPHAGE) 1000 MG tablet Take 1 tablet (1,000 mg total) by mouth daily.  . metoprolol succinate (TOPROL-XL) 25 MG 24 hr tablet Take 1 tablet (25 mg total) by mouth daily. Take with or immediately following a meal.  . [DISCONTINUED] metFORMIN (GLUCOPHAGE) 1000 MG tablet Take 1 tablet (1,000 mg total) by mouth daily.  . [DISCONTINUED] metoprolol succinate (TOPROL-XL) 25 MG 24 hr tablet Take 1 tablet (25 mg total) by mouth daily. Take with or immediately following a meal.Must make appointment before any further refills    Results for orders placed or performed in visit on 12/12/19 (from the past 72 hour(s))  POCT HgB A1C  Status: Abnormal   Collection Time: 12/12/19  3:12 PM  Result Value Ref Range   Hemoglobin A1C 6.6 (A) 4.0 - 5.6 %   HbA1c POC (<> result, manual entry)     HbA1c, POC (prediabetic range)     HbA1c, POC (controlled diabetic range)      No results found.  Depression screen [facility] 2/9 06/14/2019 03/15/2018  Decreased Interest 0 0  Down, Depressed, Hopeless 0 0  PHQ - 2 Score 0 0  Altered sleeping 1 1  Tired, decreased energy 1 1  Change in appetite 0 0  Feeling bad or failure about yourself  0 0  Trouble concentrating 0 0  Moving slowly or fidgety/restless 0 1  Suicidal thoughts 0 0  PHQ-9 Score 2 3  Difficult doing work/chores - Not difficult at all    GAD 7 : Generalized Anxiety Score 06/14/2019 03/15/2018  Nervous, Anxious, on Edge 1 0  Control/stop worrying 0 0  Worry too much - different things 1 1  Trouble relaxing 0 0  Restless 1 1  Easily annoyed or irritable 1 1  Afraid - awful  might happen 0 0  Total GAD 7 Score 4 3  Anxiety Difficulty - Not difficult at all      All questions at time of visit were answered - patient instructed to contact office with any additional concerns or updates.  ER/RTC precautions were reviewed with the patient.  Please note: voice recognition software was used to produce this document, and typos may escape review. Please contact Dr. Sheppard Coil for any needed clarifications.

## 2020-04-11 ENCOUNTER — Ambulatory Visit: Payer: BLUE CROSS/BLUE SHIELD | Admitting: Osteopathic Medicine

## 2020-06-10 ENCOUNTER — Encounter: Payer: Self-pay | Admitting: Osteopathic Medicine

## 2020-10-05 ENCOUNTER — Other Ambulatory Visit: Payer: Self-pay | Admitting: Osteopathic Medicine

## 2020-11-04 ENCOUNTER — Other Ambulatory Visit: Payer: Self-pay | Admitting: Osteopathic Medicine

## 2020-11-06 ENCOUNTER — Ambulatory Visit: Payer: Self-pay | Admitting: Osteopathic Medicine

## 2021-01-23 ENCOUNTER — Other Ambulatory Visit: Payer: Self-pay | Admitting: Osteopathic Medicine

## 2021-01-23 DIAGNOSIS — E119 Type 2 diabetes mellitus without complications: Secondary | ICD-10-CM

## 2021-03-03 ENCOUNTER — Other Ambulatory Visit: Payer: Self-pay | Admitting: Osteopathic Medicine

## 2022-02-27 ENCOUNTER — Other Ambulatory Visit: Payer: Self-pay | Admitting: Osteopathic Medicine

## 2024-05-15 ENCOUNTER — Encounter: Payer: Self-pay | Admitting: Osteopathic Medicine
